# Patient Record
Sex: Male | Born: 1965 | Race: White | Hispanic: No | Marital: Married | Smoking: Never smoker
Health system: Southern US, Community
[De-identification: ages and names within clinical notes are randomized; demographics above are authoritative.]

## PROBLEM LIST (undated history)

## (undated) DIAGNOSIS — R011 Cardiac murmur, unspecified: Secondary | ICD-10-CM

## (undated) DIAGNOSIS — E039 Hypothyroidism, unspecified: Secondary | ICD-10-CM

## (undated) DIAGNOSIS — A630 Anogenital (venereal) warts: Secondary | ICD-10-CM

## (undated) DIAGNOSIS — K219 Gastro-esophageal reflux disease without esophagitis: Secondary | ICD-10-CM

## (undated) DIAGNOSIS — A4902 Methicillin resistant Staphylococcus aureus infection, unspecified site: Secondary | ICD-10-CM

## (undated) DIAGNOSIS — L719 Rosacea, unspecified: Secondary | ICD-10-CM

## (undated) HISTORY — DX: Methicillin resistant Staphylococcus aureus infection, unspecified site: A49.02

## (undated) HISTORY — DX: Rosacea, unspecified: L71.9

## (undated) HISTORY — PX: COLONOSCOPY: SHX174

## (undated) HISTORY — DX: Hypothyroidism, unspecified: E03.9

## (undated) HISTORY — DX: Anogenital (venereal) warts: A63.0

## (undated) HISTORY — DX: Gastro-esophageal reflux disease without esophagitis: K21.9

## (undated) HISTORY — PX: WISDOM TOOTH EXTRACTION: SHX21

## (undated) HISTORY — PX: TONSILLECTOMY: SUR1361

## (undated) HISTORY — DX: Cardiac murmur, unspecified: R01.1

---

## 1999-05-17 HISTORY — PX: LASIK: SHX215

## 1999-12-07 ENCOUNTER — Other Ambulatory Visit: Admission: RE | Admit: 1999-12-07 | Discharge: 1999-12-07 | Payer: Self-pay | Admitting: Gastroenterology

## 1999-12-07 ENCOUNTER — Encounter (INDEPENDENT_AMBULATORY_CARE_PROVIDER_SITE_OTHER): Payer: Self-pay | Admitting: *Deleted

## 2000-01-24 ENCOUNTER — Encounter: Payer: Self-pay | Admitting: Gastroenterology

## 2005-01-26 ENCOUNTER — Ambulatory Visit: Payer: Self-pay | Admitting: Internal Medicine

## 2005-04-27 ENCOUNTER — Ambulatory Visit: Payer: Self-pay | Admitting: Internal Medicine

## 2005-10-06 ENCOUNTER — Ambulatory Visit: Payer: Self-pay | Admitting: Internal Medicine

## 2005-10-20 ENCOUNTER — Ambulatory Visit: Payer: Self-pay | Admitting: Internal Medicine

## 2006-01-05 ENCOUNTER — Ambulatory Visit: Payer: Self-pay | Admitting: Internal Medicine

## 2006-05-11 ENCOUNTER — Ambulatory Visit: Payer: Self-pay | Admitting: Internal Medicine

## 2006-06-20 ENCOUNTER — Ambulatory Visit: Payer: Self-pay | Admitting: Internal Medicine

## 2006-08-17 ENCOUNTER — Ambulatory Visit: Payer: Self-pay | Admitting: Internal Medicine

## 2007-01-19 ENCOUNTER — Ambulatory Visit: Payer: Self-pay | Admitting: Internal Medicine

## 2007-01-22 ENCOUNTER — Ambulatory Visit: Payer: Self-pay | Admitting: Gastroenterology

## 2007-01-22 LAB — CONVERTED CEMR LAB
ALT: 18 units/L (ref 0–53)
AST: 16 units/L (ref 0–37)
BUN: 14 mg/dL (ref 6–23)
Basophils Relative: 0.5 % (ref 0.0–1.0)
Calcium: 8.9 mg/dL (ref 8.4–10.5)
Chloride: 106 meq/L (ref 96–112)
Creatinine, Ser: 1.2 mg/dL (ref 0.4–1.5)
Eosinophils Relative: 13.1 % — ABNORMAL HIGH (ref 0.0–5.0)
GFR calc Af Amer: 87 mL/min
GFR calc non Af Amer: 72 mL/min
HCT: 41.1 % (ref 39.0–52.0)
Hemoglobin: 14.4 g/dL (ref 13.0–17.0)
MCHC: 35.1 g/dL (ref 30.0–36.0)
Monocytes Absolute: 0.5 10*3/uL (ref 0.2–0.7)
Neutrophils Relative %: 62.2 % (ref 43.0–77.0)
Potassium: 4.2 meq/L (ref 3.5–5.1)
RDW: 12.6 % (ref 11.5–14.6)
WBC: 6.8 10*3/uL (ref 4.5–10.5)

## 2007-01-31 ENCOUNTER — Telehealth: Payer: Self-pay | Admitting: Internal Medicine

## 2007-02-08 ENCOUNTER — Ambulatory Visit: Payer: Self-pay | Admitting: Gastroenterology

## 2007-02-22 ENCOUNTER — Encounter: Payer: Self-pay | Admitting: Gastroenterology

## 2007-02-22 ENCOUNTER — Ambulatory Visit: Payer: Self-pay | Admitting: Gastroenterology

## 2007-02-22 ENCOUNTER — Encounter: Payer: Self-pay | Admitting: Internal Medicine

## 2007-03-27 ENCOUNTER — Ambulatory Visit: Payer: Self-pay | Admitting: Internal Medicine

## 2007-03-27 DIAGNOSIS — E039 Hypothyroidism, unspecified: Secondary | ICD-10-CM | POA: Insufficient documentation

## 2007-04-04 ENCOUNTER — Encounter (INDEPENDENT_AMBULATORY_CARE_PROVIDER_SITE_OTHER): Payer: Self-pay | Admitting: *Deleted

## 2007-05-17 DIAGNOSIS — A4902 Methicillin resistant Staphylococcus aureus infection, unspecified site: Secondary | ICD-10-CM

## 2007-05-17 HISTORY — DX: Methicillin resistant Staphylococcus aureus infection, unspecified site: A49.02

## 2007-07-26 ENCOUNTER — Ambulatory Visit: Payer: Self-pay | Admitting: Internal Medicine

## 2007-07-31 ENCOUNTER — Encounter (INDEPENDENT_AMBULATORY_CARE_PROVIDER_SITE_OTHER): Payer: Self-pay | Admitting: *Deleted

## 2007-07-31 LAB — CONVERTED CEMR LAB: TSH: 3.31 microintl units/mL (ref 0.35–5.50)

## 2007-08-14 DIAGNOSIS — Z87442 Personal history of urinary calculi: Secondary | ICD-10-CM

## 2007-08-23 ENCOUNTER — Telehealth (INDEPENDENT_AMBULATORY_CARE_PROVIDER_SITE_OTHER): Payer: Self-pay | Admitting: *Deleted

## 2007-08-31 ENCOUNTER — Ambulatory Visit: Payer: Self-pay | Admitting: Internal Medicine

## 2008-04-07 ENCOUNTER — Ambulatory Visit: Payer: Self-pay | Admitting: Internal Medicine

## 2008-04-15 ENCOUNTER — Encounter (INDEPENDENT_AMBULATORY_CARE_PROVIDER_SITE_OTHER): Payer: Self-pay | Admitting: *Deleted

## 2008-04-15 LAB — CONVERTED CEMR LAB: TSH: 6.55 microintl units/mL — ABNORMAL HIGH (ref 0.35–5.50)

## 2008-04-29 ENCOUNTER — Ambulatory Visit: Payer: Self-pay | Admitting: Gastroenterology

## 2008-06-19 ENCOUNTER — Ambulatory Visit: Payer: Self-pay | Admitting: Internal Medicine

## 2008-06-23 ENCOUNTER — Encounter (INDEPENDENT_AMBULATORY_CARE_PROVIDER_SITE_OTHER): Payer: Self-pay | Admitting: *Deleted

## 2008-12-17 ENCOUNTER — Ambulatory Visit: Payer: Self-pay | Admitting: Internal Medicine

## 2008-12-22 ENCOUNTER — Telehealth (INDEPENDENT_AMBULATORY_CARE_PROVIDER_SITE_OTHER): Payer: Self-pay | Admitting: *Deleted

## 2008-12-24 ENCOUNTER — Ambulatory Visit: Payer: Self-pay | Admitting: Internal Medicine

## 2008-12-26 ENCOUNTER — Encounter (INDEPENDENT_AMBULATORY_CARE_PROVIDER_SITE_OTHER): Payer: Self-pay | Admitting: *Deleted

## 2009-04-07 ENCOUNTER — Ambulatory Visit: Payer: Self-pay | Admitting: Internal Medicine

## 2009-04-08 ENCOUNTER — Ambulatory Visit: Payer: Self-pay | Admitting: Internal Medicine

## 2009-04-08 DIAGNOSIS — R030 Elevated blood-pressure reading, without diagnosis of hypertension: Secondary | ICD-10-CM | POA: Insufficient documentation

## 2009-04-08 DIAGNOSIS — H9319 Tinnitus, unspecified ear: Secondary | ICD-10-CM

## 2009-04-08 LAB — CONVERTED CEMR LAB: TSH: 4.86 microintl units/mL (ref 0.35–5.50)

## 2009-04-13 ENCOUNTER — Telehealth: Payer: Self-pay | Admitting: Internal Medicine

## 2009-04-14 ENCOUNTER — Telehealth: Payer: Self-pay | Admitting: Internal Medicine

## 2009-07-02 ENCOUNTER — Telehealth: Payer: Self-pay | Admitting: Gastroenterology

## 2009-07-03 ENCOUNTER — Telehealth: Payer: Self-pay | Admitting: Gastroenterology

## 2009-07-14 ENCOUNTER — Telehealth (INDEPENDENT_AMBULATORY_CARE_PROVIDER_SITE_OTHER): Payer: Self-pay | Admitting: *Deleted

## 2009-07-15 ENCOUNTER — Ambulatory Visit: Payer: Self-pay | Admitting: Internal Medicine

## 2009-08-04 ENCOUNTER — Ambulatory Visit: Payer: Self-pay | Admitting: Gastroenterology

## 2009-11-25 ENCOUNTER — Telehealth: Payer: Self-pay | Admitting: Gastroenterology

## 2009-12-18 ENCOUNTER — Telehealth (INDEPENDENT_AMBULATORY_CARE_PROVIDER_SITE_OTHER): Payer: Self-pay | Admitting: *Deleted

## 2009-12-21 ENCOUNTER — Ambulatory Visit: Payer: Self-pay | Admitting: Internal Medicine

## 2009-12-21 LAB — CONVERTED CEMR LAB
Albumin: 4.1 g/dL (ref 3.5–5.2)
Basophils Relative: 0.3 % (ref 0.0–3.0)
Blood in Urine, dipstick: NEGATIVE
Chloride: 106 meq/L (ref 96–112)
Cholesterol: 144 mg/dL (ref 0–200)
Eosinophils Absolute: 0.3 10*3/uL (ref 0.0–0.7)
HDL: 38.2 mg/dL — ABNORMAL LOW (ref >39.00)
Hemoglobin: 14.2 g/dL (ref 13.0–17.0)
Ketones, urine, test strip: NEGATIVE
MCHC: 34.6 g/dL (ref 30.0–36.0)
MCV: 90.3 fL (ref 78.0–100.0)
Monocytes Absolute: 0.4 10*3/uL (ref 0.1–1.0)
Neutro Abs: 3.1 10*3/uL (ref 1.4–7.7)
Nitrite: NEGATIVE
Potassium: 4.5 meq/L (ref 3.5–5.1)
RBC: 4.53 M/uL (ref 4.22–5.81)
RDW: 14.1 % (ref 11.5–14.6)
Sodium: 141 meq/L (ref 135–145)
TSH: 3.14 microintl units/mL (ref 0.35–5.50)
Total Protein: 6.8 g/dL (ref 6.0–8.3)
Triglycerides: 120 mg/dL (ref 0.0–149.0)
Urobilinogen, UA: 0.2
VLDL: 24 mg/dL (ref 0.0–40.0)
WBC Urine, dipstick: NEGATIVE

## 2009-12-29 ENCOUNTER — Ambulatory Visit: Payer: Self-pay | Admitting: Internal Medicine

## 2010-06-13 LAB — CONVERTED CEMR LAB
ALT: 19 units/L (ref 0–53)
AST: 20 units/L (ref 0–37)
Alkaline Phosphatase: 50 units/L (ref 39–117)
BUN: 13 mg/dL (ref 6–23)
Basophils Absolute: 0 10*3/uL (ref 0.0–0.1)
Basophils Relative: 0.1 % (ref 0.0–1.0)
Basophils Relative: 0.1 % (ref 0.0–3.0)
CO2: 30 meq/L (ref 19–32)
Chloride: 107 meq/L (ref 96–112)
Cholesterol: 133 mg/dL (ref 0–200)
Creatinine, Ser: 0.9 mg/dL (ref 0.4–1.5)
Eosinophils Relative: 2.6 % (ref 0.0–5.0)
GFR calc Af Amer: 121 mL/min
GFR calc non Af Amer: 78.4 mL/min (ref >60)
HCT: 40.7 % (ref 39.0–52.0)
HCT: 45.3 % (ref 39.0–52.0)
HDL: 34.1 mg/dL — ABNORMAL LOW (ref >39.0)
Hemoglobin: 14.3 g/dL (ref 13.0–17.0)
Hemoglobin: 15.5 g/dL (ref 13.0–17.0)
LDL Cholesterol: 87 mg/dL (ref 0–99)
Lymphocytes Relative: 12.1 % (ref 12.0–46.0)
Lymphs Abs: 1.1 10*3/uL (ref 0.7–4.0)
MCHC: 34.3 g/dL (ref 30.0–36.0)
MCV: 90 fL (ref 78.0–100.0)
Monocytes Absolute: 0.4 10*3/uL (ref 0.1–1.0)
Monocytes Absolute: 0.4 10*3/uL (ref 0.2–0.7)
Monocytes Relative: 6.6 % (ref 3.0–11.0)
Monocytes Relative: 7.9 % (ref 3.0–12.0)
Neutro Abs: 5.1 10*3/uL (ref 1.4–7.7)
Neutrophils Relative %: 78.4 % — ABNORMAL HIGH (ref 43.0–77.0)
Platelets: 150 10*3/uL (ref 150.0–400.0)
Potassium: 3.8 meq/L (ref 3.5–5.1)
Potassium: 4.3 meq/L (ref 3.5–5.1)
RBC: 4.52 M/uL (ref 4.22–5.81)
RDW: 12.3 % (ref 11.5–14.6)
Sodium: 140 meq/L (ref 135–145)
Sodium: 141 meq/L (ref 135–145)
TSH: 6.97 microintl units/mL — ABNORMAL HIGH (ref 0.35–5.50)
Total Bilirubin: 0.8 mg/dL (ref 0.3–1.2)
Total Bilirubin: 0.9 mg/dL (ref 0.3–1.2)
Total CHOL/HDL Ratio: 4
Total Protein: 7.2 g/dL (ref 6.0–8.3)
Total Protein: 7.5 g/dL (ref 6.0–8.3)
VLDL: 19.2 mg/dL (ref 0.0–40.0)
WBC: 4.7 10*3/uL (ref 4.5–10.5)

## 2010-06-15 NOTE — Progress Notes (Signed)
Summary: Questions about meds  Medications Added ASACOL 400 MG TBEC (MESALAMINE) 3 by mouth two times a day       Phone Note Call from Patient Call back at Home Phone [phone]   Caller: Patient Call For: Dr. Fuller Plan Reason for Call: Talk to Nurse Summary of Call: pt. is taking Lialda and wants to know if he can go back to taking the Asacol Initial call taken by: Webb Laws,  November 25, 2009 3:10 PM  Follow-up for Phone Call        Patient  would like to return to Asacol.  He is c/o gassy, not having regular BM, nausea.  He started with these symptoms when he started on Lialda, he thought these symptoms would improve after being on Lialda for a while, but they now seem to be getting worse.  he would like to switch back to asacol.  Please advise Follow-up by: Barb Merino RN, [place],  November 25, 2009 3:30 PM  Additional Follow-up for Phone Call Additional follow up Details #1::        Can [place] and resume Asacol 400 Mg  Tbec (Mesalamine) .... Take 3 Tablets By Mouth Two Times A Day  Additional Follow-up by: Ladene Artist MD Marval Regal,  November 25, 2009 3:35 PM    Additional Follow-up for Phone Call Additional follow up Details #2::    Patient  aware new rx sent.  i have left him a message.  I have asked him to call me back if this is not the correct pharmacy. Follow-up by: Barb Merino RN, [place],  November 26, 2009 9:08 AM  New/Updated Medications: ASACOL 400 MG TBEC (MESALAMINE) 3 by mouth two times a day Prescriptions: ASACOL 400 MG TBEC (MESALAMINE) 3 by mouth two times a day  #180 x 6   Entered by:   Barb Merino RN, [place]   Authorized by:   Ladene Artist MD [facility]   Signed by:   Barb Merino RN, CGRN on 11/26/2009   Method used:   Electronically to        [facility]* (retail)       [address] [place], [place]  [address]       Ph: [phone]       Fax: [phone]   RxID:   [id number]

## 2010-06-15 NOTE — Assessment & Plan Note (Signed)
Summary: medication refill--ch.   History of Present Illness Visit Type: Follow-up Visit Primary GI MD: Joylene Igo MD [facility] Primary Provider: Unice Cobble, MD Requesting Provider: n/a Chief Complaint: Needs Asacol refills, No GI complaints History of Present Illness:   This is a return visit for inflammatory bowel disease, presenting as colitis, type unspecified, diagnosed in 2001. His been asymptomatic on Asacol. He states that he often forgets his evening dose of Asacol.   GI Review of Systems      Denies abdominal pain, acid reflux, belching, bloating, chest pain, dysphagia with liquids, dysphagia with solids, heartburn, loss of appetite, nausea, vomiting, vomiting blood, weight loss, and  weight gain.        Denies anal fissure, black tarry stools, change in bowel habit, constipation, diarrhea, diverticulosis, fecal incontinence, heme positive stool, hemorrhoids, irritable bowel syndrome, jaundice, light color stool, liver problems, rectal bleeding, and  rectal pain.   Current Medications (verified): 1)  Synthroid 75 Mcg  Tabs (Levothyroxine Sodium) .Marland Kitchen.. 1 Once Daily  Except 1&1/2 T,th, & Sat 2)  Asacol 400 Mg  Tbec (Mesalamine) .... Take 3 Tablets By Mouth Two Times A Day  Allergies (verified): 1)  ! * Doxycycline Family  Past History:  Past Surgical History: Last updated: 12/24/2008 Colonoscopy : colitis 2002 & 2008, Dr Fuller Plan (Note: asymptomatic 2003-2008 off meds) Tonsillectomy Lasik bilat 2001  Family History: Last updated: 12/24/2008 Father:OA Mother: thyroid condition, benign R occipital cns tumor Siblings: neg Family History of Colon Cancer (? appendiceal primary): Mat Uncle Family History of Colon Polyps: Pat. Uncle; MGF HTN,lung CA; PGF COAD,? 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 All other ROS were reviewed and were negative.   Vital Signs:  Patient profile:   45 year old male Height:      72 inches Weight:      185 pounds BMI:     25.18 BSA:     2.06 Pulse rate:   66 / minute Pulse rhythm:   regular BP sitting:   98 / 62  (left arm)  Vitals Entered By: Dona Ana Deborra Medina) (August 04, 2009 3:51 PM)  Physical Exam  General:  Well developed, well nourished, no acute distress. Head:  Normocephalic and atraumatic. Eyes:  PERRLA, no icterus. Ears:  Normal auditory acuity. Mouth:  No deformity or lesions, dentition normal. Lungs:  Clear throughout to auscultation. Heart:  Regular rate and rhythm; no murmurs, rubs,  or bruits. Abdomen:  Soft, nontender and nondistended. No masses, hepatosplenomegaly or hernias noted. Normal bowel sounds. Psych:  Alert and cooperative. Normal mood and affect.  Impression & Recommendations:  Problem # 1:  COLITIS (ICD-558.9) IBD, type unspecified. Original diagnosis in 2001. Change to Lialda 2.4 g q.a.m. to help with compliance. Surveillance colonoscopy October 2013.  Patient Instructions: 1)  Pick up your prescription from your pharmacy.  2)  Please schedule a follow-up appointment in 1 year. 3)  The medication list was reviewed and reconciled.  All changed / newly prescribed medications were explained.  A complete medication list was provided to the patient / caregiver.  Prescriptions: LIALDA 1.2 GM TBEC (MESALAMINE) 2 tablets by mouth every morning  #60 x 11    Entered by:   Marlon Pel CMA ([place])   Authorized by:   Ladene Artist MD [facility]   Signed by:   Marlon Pel CMA ([place]) on 08/04/2009   Method used:   Electronically to        [facility]* (retail)       [address]       [place], [place]  [address]       Ph: [phone]       Fax: [phone]   RxID:   [id number]

## 2010-06-15 NOTE — Progress Notes (Signed)
Summary: Schedule REV    Phone Note Outgoing Call Call back at [facility] Phone [phone]   Call placed by: Marlon Pel CMA Deborra Medina),  July 02, 2009 4:09 PM Call placed to: Patient Summary of Call: left message for pt  to call back and schedule a REV to get his Asacol refilled. I informed pt that he has to be seen in the office before we can call and get his PA done for his Asacol.  Initial call taken by: Marlon Pel CMA Deborra Medina),  July 02, 2009 4:11 PM

## 2010-06-15 NOTE — Progress Notes (Signed)
Summary: Medication  Medications Added ASACOL 400 MG  TBEC (MESALAMINE) take 3 tablets by mouth two times a day       Phone Note Call from Patient Call back at Home Phone [phone]   Caller: Patient Call For: Dr. Fuller Plan Summary of Call: Pt scheduled for 08-04-09 and is completely out of his Asacol. Initial call taken by: Webb Laws,  July 03, 2009 1:58 PM  Follow-up for Phone Call        left message for pt  to call back  Follow-up by: Marlon Pel CMA Deborra Medina),  July 03, 2009 2:39 PM  Additional Follow-up for Phone Call Additional follow up Details #1::        Given one refill until appt on 3-22 and told pt to keep appt for any further refills.  Additional Follow-up by: Marlon Pel CMA ([place]),  July 03, 2009 3:09 PM    New/Updated Medications: ASACOL 400 MG  TBEC (MESALAMINE) take 3 tablets by mouth two times a day Prescriptions: ASACOL 400 MG  TBEC (MESALAMINE) take 3 tablets by mouth two times a day  #180 x 0   Entered by:   Marlon Pel CMA ([place])   Authorized by:   Ladene Artist MD [facility]   Signed by:   Marlon Pel CMA ([place]) on 07/03/2009   Method used:   Electronically to        [facility]* (retail)       [address]       [place], [place]  [address]       Ph: [phone]       Fax: [phone]   RxID:   [id number]

## 2010-06-15 NOTE — Assessment & Plan Note (Signed)
Summary: CPX/YEARLY//PH   Vital Signs:  Patient profile:   45 year old male Height:      72 inches Weight:      184 pounds Temp:     98.2 degrees F oral Pulse rate:   80 / minute Resp:     20 per minute BP sitting:   110 / 78  (left arm)  Vitals Entered By: Rolla Flatten CMA (December 29, 2009 1:00 PM)  CC: cpx, , General Medical Evaluation   Primary Care Provider:  Unice Cobble, MD  CC:  cpx, , and General Medical Evaluation.  History of Present Illness: Tanner White is here for a physical; he is asymptomatic.  Preventive Screening-Counseling & Management  Caffeine-Diet-Exercise     Does Patient Exercise: no  Current Medications (verified): 1)  Synthroid 75 Mcg  Tabs (Levothyroxine Sodium) .Marland Kitchen.. 1 Once Daily  Except 1&1/2 T,th, & Sat 2)  Asacol 400 Mg Tbec (Mesalamine) .... 3 By Mouth Two Times A Day  Allergies (verified): 1)  ! 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 primary appendicial Pat. Uncle: colon polyps; MGF :HTN,lung cancer; PGF: COAD,? 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         Weight up 5# off CVE. No exertional chest pain  Physical Exam  General:  well-nourished; alert,appropriate and cooperative throughout examination Head:  Normocephalic and atraumatic without obvious abnormalities. No apparent alopecia ; moustache & goatee Eyes:  No corneal or conjunctival inflammation noted. Perrla. Funduscopic exam benign, without hemorrhages, exudates or papilledema. No lid lag. Ears:  External ear exam shows no significant lesions or deformities.  Otoscopic examination reveals clear canals, tympanic membranes are intact bilaterally without bulging, retraction, inflammation or discharge. Hearing is grossly normal bilaterally. Nose:  External nasal examination shows no deformity or inflammation. Nasal mucosa are pink and moist without lesions or exudates. septal dislocation & deviation Mouth:  Oral mucosa and oropharynx without lesions or exudates.  Teeth in good repair. Neck:  No deformities, masses, or tenderness noted. Lungs:  Normal respiratory effort, chest expands symmetrically. Lungs are clear to auscultation, no crackles or wheezes. Heart:  Normal rate and regular rhythm. S1 and S2 normal without gallop, murmur, click, rub .S4 Abdomen:  Bowel sounds positive,abdomen soft and non-tender without masses, organomegaly or hernias noted but weakness L inguinal  area Rectal:  No external abnormalities noted. Normal sphincter tone. No rectal masses or tenderness. Genitalia:  Testes bilaterally descended without nodularity,  tenderness or masses. No scrotal  masses or lesions. No penis lesions or urethral discharge. L varicocele.   Prostate:  Prostate gland firm and smooth, no enlargement but R lobe slightly smaller w/o  nodularity, tenderness, mass,  or induration. Msk:  No deformity or scoliosis noted of thoracic or lumbar spine.   Pulses:  R and L carotid,radial,dorsalis pedis and posterior tibial pulses are full and equal bilaterally Extremities:  No clubbing, cyanosis, edema, or deformity noted with normal full range of motion of all joints.   Neurologic:  alert & oriented X3 and DTRs symmetrical and normal.   No tremor  Skin:  Intact without suspicious lesions or rashes Cervical Nodes:  No lymphadenopathy noted Axillary Nodes:  No palpable lymphadenopathy Inguinal Nodes:  No significant adenopathy Psych:  memory intact for recent and remote, normally interactive, and good eye contact.     Impression & Recommendations:  Problem # 1:  ROUTINE GENERAL MEDICAL EXAM@HEALTH  CARE FACL (ICD-V70.0)  Orders: EKG w/ Interpretation (93000)  Problem # 2:  HYPOTHYROIDISM (ICD-244.9)  Problem # 3:  COLITIS (ICD-558.9) as per Dr Fuller Plan  Complete Medication List: 1)  Synthroid 75 Mcg Tabs (levothyroxine Sodium)  .Marland Kitchen.. 1 once daily  except 1&1/2 t,th, & sat 2)  Asacol 400 Mg Tbec (Mesalamine) .... 3 by mouth two times a day  Patient Instructions: 1)  It is important that you exercise regularly at least 20 minutes 5 times a week. If you develop chest pain, have severe difficulty breathing, or feel very tired , stop exercising immediately and seek medical attention. Prescriptions: SYNTHROID 75 MCG  TABS (LEVOTHYROXINE SODIUM) 1 once daily  except 1&1/2 T,Th, & Sat  #90 x 3   Entered and Authorized by:   Unice Cobble MD   Signed by:   Unice Cobble MD on 12/29/2009   Method used:   Print then Give to Patient   RxID:   [id number]

## 2010-06-15 NOTE — Procedures (Signed)
Summary: Conservation officer, historic buildings   Imported By: Phillis Knack 08/05/2009 10:09:30  _____________________________________________________________________  External Attachment:    Type:   Image     Comment:   External Document

## 2010-06-15 NOTE — Progress Notes (Signed)
Summary: [facility]  Phone Note Call from Patient   Summary of Call: PT IS COMING IN ON MONDAY FOR LABS AND HAS AN APPT FOR 8 AM --NEEDA ORDERS TO BE PUT IN FOR APPT. CPX ON THE 8/16 Initial call taken by: Velora Heckler,  December 18, 2009 2:55 PM  Follow-up for Phone Call        Lipid, CBCD, Hep, BMP,TSH,PSA, Stool Cards, Udip V70.0/244.9 Follow-up by: Georgette Dover CMA,  December 18, 2009 3:16 PM

## 2010-06-15 NOTE — Progress Notes (Signed)
Summary: refill request  Phone Note Refill Request Message from:  Fax from Pharmacy on July 14, 2009 2:56 PM  Refills Requested: Medication #1:  SYNTHROID 75 MCG  TABS (LEVOTHYROXINE SODIUM) 1 once daily (generic OK)   Dosage confirmed as above?Dosage Confirmed   Brand Name Necessary? No refill request to [place]  Initial call taken by: Otho Ket,  July 14, 2009 2:58 PM  Follow-up for Phone Call        Patient was due to recheck labs2/2011 per 04/08/2009 append re:TSH. Please contact patient to schedule. We will fill med x 1 until labs checked Follow-up by: Georgette Dover,  July 14, 2009 3:20 PM  Additional Follow-up for Phone Call Additional follow up Details #1::        contacted pt. and scheduled TSH to drawn this week, and informed him a refill x1 had beed called in.Jennifer Rich  July 14, 2009 4:01 PM       Prescriptions: SYNTHROID 75 MCG  TABS (LEVOTHYROXINE SODIUM) 1 once daily (generic OK)  #90 x 0   Entered by:   Georgette Dover   Authorized by:   Unice Cobble MD   Signed by:   Georgette Dover on 07/14/2009   Method used:   Faxed to ...       [facility]* (retail)       [address]       [place], [place]  [address]       Ph: [phone]       Fax: [phone]   RxID:   [id number]

## 2010-06-25 ENCOUNTER — Ambulatory Visit (INDEPENDENT_AMBULATORY_CARE_PROVIDER_SITE_OTHER): Payer: BC Managed Care – PPO | Admitting: Internal Medicine

## 2010-06-25 ENCOUNTER — Encounter: Payer: Self-pay | Admitting: Internal Medicine

## 2010-06-25 DIAGNOSIS — J069 Acute upper respiratory infection, unspecified: Secondary | ICD-10-CM

## 2010-07-01 NOTE — Assessment & Plan Note (Signed)
Summary: congested,nose red/cbs   Vital Signs:  Patient profile:   45 year old male Weight:      183 pounds BMI:     24.91 Temp:     98.2 degrees F oral Pulse rate:   64 / minute Resp:     14 per minute BP sitting:   124 / 80  (left arm) Cuff size:   large  Vitals Entered By: Georgette Dover CMA (June 25, 2010 10:43 AM) CC: ? sinus infection-bridge of nose hurts, unable to wash face or touch nose : Inflammed, red, and painful, URI symptoms   Primary Care Provider:  Unice Cobble, MD  CC:  ? sinus infection-bridge of nose hurts, unable to wash face or touch nose : Inflammed, red, and painful, and URI symptoms.  History of Present Illness:    Onset 10 days ago as neck stffness followed by head congestion & ear pressure. He now  reports  tenderness over bridge of nose, scant purulent nasal discharge and dry cough, but denies nasal congestion, sore throat, and earache.  The patient denies fever, dyspnea, and wheezing.  The patient denies itchy watery eyes, sneezing, and headache.  The patient denies the following risk factors for Strep sinusitis: bilateral facial pain, tooth pain, and tender adenopathy.  Rx: Advil Cold & Sinus. PMH of MRSA OS.  Current Medications (verified): 1)  Synthroid 75 Mcg  Tabs (Levothyroxine Sodium) .Marland Kitchen.. 1 Once Daily  Except 1&1/2 T,th, & Sat 2)  Asacol 400 Mg Tbec (Mesalamine) .... 3 By Mouth Two Times A Day  Allergies: 1)  ! * Doxycycline Family  Physical Exam  General:  well-nourished,in no acute distress; alert,appropriate and cooperative throughout examination Eyes:  No corneal or conjunctival inflammation noted. EOMI. Perrla.  Vision grossly normal. Ears:  External ear exam shows no significant lesions or deformities.  Otoscopic examination reveals clear canals, tympanic membranes are intact bilaterally without bulging, retraction, inflammation or discharge. Hearing is grossly normal bilaterally. Nose:  External nasal examination shows no deformity  or inflammation. Nasal mucosa are  dry & erythematous on R  without lesions or exudates. Minimal septal dislocation. Tender over nasal  bridge Mouth:  Oral mucosa and oropharynx without lesions or exudates.  Teeth in good repair. Lungs:  Normal respiratory effort, chest expands symmetrically. Lungs are clear to auscultation, no crackles or wheezes. Skin:  Intact without suspicious lesions or rashes Cervical Nodes:  No lymphadenopathy noted Axillary Nodes:  No palpable lymphadenopathy   Impression & Recommendations:  Problem # 1:  URI (ICD-465.9) tender nasal bridge; PMH of MRSA OS  Complete Medication List: 1)  Synthroid 75 Mcg Tabs (levothyroxine Sodium)  .Marland Kitchen.. 1 once daily  except 1&1/2 t,th, & sat 2)  Asacol 400 Mg Tbec (Mesalamine) .... 3 by mouth two times a day 3)  Cephalexin 500 Mg Caps (Cephalexin) .Marland Kitchen.. 1 two times a day  Patient Instructions: 1)  Warm compresses three times a day as needed to nasal bridge . Prescriptions: CEPHALEXIN 500 MG CAPS (CEPHALEXIN) 1 two times a day  #20 x 0   Entered and Authorized by:   Unice Cobble MD   Signed by:   Unice Cobble MD on 06/25/2010   Method used:   Electronically to        [facility] [address] (retail)       [address]       [place], [place]  [address]       Ph: [phone] or [phone]  Fax: [phone]   RxID:   [id number]    Orders Added: 1)  Est. Patient Level III [84784]

## 2010-07-21 ENCOUNTER — Encounter: Payer: Self-pay | Admitting: Internal Medicine

## 2010-07-21 ENCOUNTER — Ambulatory Visit (INDEPENDENT_AMBULATORY_CARE_PROVIDER_SITE_OTHER): Payer: BC Managed Care – PPO | Admitting: Internal Medicine

## 2010-07-21 ENCOUNTER — Telehealth (INDEPENDENT_AMBULATORY_CARE_PROVIDER_SITE_OTHER): Payer: Self-pay | Admitting: *Deleted

## 2010-07-21 DIAGNOSIS — L0201 Cutaneous abscess of face: Secondary | ICD-10-CM

## 2010-07-21 DIAGNOSIS — L03211 Cellulitis of face: Secondary | ICD-10-CM | POA: Insufficient documentation

## 2010-07-21 DIAGNOSIS — L719 Rosacea, unspecified: Secondary | ICD-10-CM | POA: Insufficient documentation

## 2010-07-27 NOTE — Assessment & Plan Note (Signed)
Summary: sore/nose/cbs   Vital Signs:  Patient profile:   45 year old male Weight:      183.4 pounds BMI:     24.96 Temp:     97.9 degrees F oral Pulse rate:   60 / minute Resp:     12 per minute BP sitting:   124 / 86  (left arm) Cuff size:   large  Vitals Entered By: Georgette Dover CMA (July 21, 2010 10:48 AM) CC: Nose sore(ongoing concern)  Comments Patient using cream for Roscea   Primary Care Provider:  Unice Cobble, MD  CC:  Nose sore(ongoing concern) .  History of Present Illness:    Recurrent redness & swelling over bridge of nose; onset 03/05 ( this is 3rd episode ; initial episode was in  2010) .It  took 4-5 days for response with  last  course of antibiotics (Cephalexin). PMH of MRSA in [place] in ?2008, treated by Dr Lucita Ferrara, Ophth with Vancomycin . PMH of  Rosacea treated with Metronidazole cream by Dr Dayton Martes.  Current Medications (verified): 1)  Synthroid 75 Mcg  Tabs (Levothyroxine Sodium) .Marland Kitchen.. 1 Once Daily  Except 1&1/2 T,th, & Sat 2)  Asacol 400 Mg Tbec (Mesalamine) .... 3 By Mouth Two Times A Day  Allergies: 1)  ! * Doxycycline Family  Review of Systems General:  Denies chills, fever, and sweats. Eyes:  Denies discharge, eye pain, red eye, and vision loss-both eyes. ENT:  Denies nasal congestion and sore throat; No frontal headache, facial pain, dental pain  or purulence.  Physical Exam  General:  well-nourished,in no acute distress; alert,appropriate and cooperative throughout examination Eyes:  No corneal or conjunctival inflammation noted. EOMI. Perrla. Vision grossly normal. Ears:  External ear exam shows no significant lesions or deformities.  Otoscopic examination reveals clear canals, tympanic membranes are intact bilaterally without bulging, retraction, inflammation or discharge. Hearing is grossly normal bilaterally. Nose:  External nasal examination shows no deformity or inflammation. Nasal mucosa are pink and moist without lesions or  exudates. Septal dislocation & deviation Mouth:  Oral mucosa and oropharynx without lesions or exudates.  Teeth in good repair. Skin:  7X7 mm tender, erythema over bridge of nose Cervical Nodes:  No lymphadenopathy noted Axillary Nodes:  No palpable lymphadenopathy   Impression & Recommendations:  Problem # 1:  CELLULITIS, FACE (ICD-682.0) recurrent over nasal bridge The following medications were removed from the medication list:    Cephalexin 500 Mg Caps (Cephalexin) .Marland Kitchen... 1 two times a day His updated medication list for this problem includes:    Amoxicillin-pot Clavulanate 500-125 Mg Tabs (Amoxicillin-pot clavulanate) .Marland Kitchen... 1 every 12 hrs with a meal  Problem # 2:  ROSACEA (MSX-115.3)  Complete Medication List: 1)  Synthroid 75 Mcg Tabs (levothyroxine Sodium)  .Marland Kitchen.. 1 once daily  except 1&1/2 t,th, & sat 2)  Asacol 400 Mg Tbec (Mesalamine) .... 3 by mouth two times a day 3)  Hibiclens 4 % Liqd (Chlorhexidine gluconate) .... Wash affected area two times a day 4)  Amoxicillin-pot Clavulanate 500-125 Mg Tabs (Amoxicillin-pot clavulanate) .Marland Kitchen.. 1 every 12 hrs with a meal  Patient Instructions: 1)  Apply Metonidazole two times a day after Hibiclens cleansing  Prescriptions: AMOXICILLIN-POT CLAVULANATE 500-125 MG TABS (AMOXICILLIN-POT CLAVULANATE) 1 every 12 hrs with a meal  #14 x 0   Entered and Authorized by:   Unice Cobble MD   Signed by:   Unice Cobble MD on 07/21/2010   Method used:   Electronically to  [facility]ail[facility]       [address]       [place], [place]  [address]       Ph: [phone] or [phone]       Fax: [phone]   RxID:   [id number] HIBICLENS 4 % LIQD (CHLORHEXIDINE GLUCONATE) wash affected area two times a day  #OTC x 0   Entered and Authorized by:   Unice Cobble MD   Signed by:   Unice Cobble MD on 07/21/2010   Method used:   Historical   RxID:   [id number]    Orders Added: 1)  Est. Patient Level III [76720]

## 2010-07-27 NOTE — Progress Notes (Signed)
Summary: correct med he is on  Phone Note Call from Patient Call back at Home Phone [phone]   Caller: Patient Summary of Call: Patient was seen today and told Dr. the wrong med he was on and this is the correct on prascion ra cr/alclometasone this is a cream that he apply up to 2 time a day as needed ( Metonidazol is not correct). This is what the Dr. need to know. Initial call taken by: Velora Heckler,  July 21, 2010 11:39 AM    New/Updated Medications: PRASCION RA 10-5 % CREA (SULFACETAMIDE-SULFUR-SUNSCREEN) apply two times a day as needed  Appended Document: correct med he is on Please enter into Med List. I shall send Dr Nevada Crane copy of OV . He should F/U with Dr Nevada Crane to see if med change necessary due to recurrent cellulitis, possibly related to Rosacea flares

## 2010-09-28 NOTE — Assessment & Plan Note (Signed)
[facility]                         GASTROENTEROLOGY OFFICE NOTE   Tanner White                    MRN:          [id number]  DATE:01/22/2007                            DOB:          October 22, 1965    REFERRING PHYSICIAN:  Kathlene November, MD   PROBLEM:  Diarrhea and abdominal cramping, fatigue.   HISTORY:  This is a generally healthy 45 year old white male known  remotely to Tanner White, who was evaluated in 2001.  At that time he  was having problems with hemoccult positive stool and colonoscopy showed  some questionable colitis in his cecum.  The cecal biopsies showed a  chronic active colitis with numerous lymphoid nodules.  There were also  minimal crypt abscesses noted, therefore more consistent with Crohn's  disease.  He also had an IBD panel done, first step, which showed a  positive ASCA and a negative pANCA and negative OMP-C.  Therefore, more  consistent with a pattern of Crohn's disease.  He was placed on Asacol 6  tablets a day.  However, he has not been seen in the past 7 years.  He  says that he took the Asacol for awhile, was feeling well, was not  really having any problems and stopped it and he has not been on any  medicine over the past 6 years.  He says over the past few months he has  had some intermittent abdominal cramping which usually occurs in the  middle of the night, this will often be followed by episodes of diarrhea  and this may last for a day or so and then clear.  He says this might  happen once a month or so.  He has noticed some increased mucus in his  stools as well, some very small amounts of blood mixed in with his bowel  movement and an increase in generalized fatigue.  Last week, about 6  days ago, he had abrupt onset of more profuse diarrhea, up to 20 bowel  movements per day.  He had some abdominal cramping but no change in the  amount of blood in his stool.  He also ran a fever for a couple of days  to  101 max.  He was nauseated but without vomiting.  He was seen by Dr.  Larose White this past Friday, restarted on Asacol and given some Canasa  suppositories.  He says at this point, over the past 2 days, he has been  improving and really has not had any diarrhea today, his nausea has  abated and his appetite is better, his fever has also resolved.  He has  had antibiotics earlier this year but none since April of 2008.   CURRENT MEDICATIONS:  1. Synthroid 25 mcg, 1-1/2 tablets daily.  2. Asacol 400 mg, two p.o. t.i.d.  3. Canasa suppositories b.i.d.   ALLERGIES/INTOLERANCES:  DOXYCYCLINE.   PAST HISTORY:  1. Hypothyroidism.  2. MRSA infection in his left eye of unclear source.  He had no known      trauma, etc., earlier this year which was treated with antibiotics      and has  cleared.   SOCIAL HISTORY:  The patient is married. He has 1 child. He is employed  in [employer]. He is a nonsmoker. Drinks alcohol occasionally on the  weekends.   FAMILY HISTORY:  Pertinent for one uncle with some sort of intestinal  cancer.  They are not certain whether or not this was colon cancer. He  has a maternal grandfather with lung cancer.  Paternal grandfather with history of alcoholism.   REVIEW OF SYSTEMS:  Pertinent only for allergy sinus symptoms and he has  been told that he has a slight heart murmur in the past.  GI:  As  outlined above.  All other review of systems completely negative.   PHYSICAL EXAM:  A well-developed, thin white male in no acute distress.  Height is 6 feet, weight is 178.8, blood pressure 120/62, pulse is 66.  HEENT:  Nontraumatic, normocephalic, EOMI, PERRLA, sclera anicteric.  NECK:  Supple.  CARDIOVASCULAR:  Regular rate and rhythm with S1 and S2.  No murmur, rub  or gallop.  PULMONARY:  Clear to A&P.  ABDOMEN:  Soft.  He is basically nontender.  There is no palpable mass  or hepatosplenomegaly, no guarding or rebound, bowel sounds are active.  RECTAL EXAM:  Not  done today.  EXTREMITIES:  No clubbing, cyanosis or edema.   IMPRESSION:  74. A 45 year old male with probable Crohn's colitis, asymptomatic over      the past several years, with a 2 month history of fatigue,      intermittent abdominal cramping, mucusy stools and small volume      hematochezia, suspect he has had an exacerbation of his      inflammatory bowel disease.  2. Probable acute infectious enteritis now resolving.   White:  1. Increase Asacol to 400 mg three tablets t.i.d.  2. Check CBC with diff., CMET and sed. rate.  3. Schedule colonoscopy with Dr. Fuller White. Risks, benefits, alternatives      discussed with the patient and he concents to proceed.  4. As the patient's acute symptoms are resolving, we may not be able      to identify any specific organisms; however, will obtain stool      cultures and observe for any clinical relapse.      Tanner Ba, PA-C  Electronically Signed      Tanner Riffle. Fuller Plan, MD, [facility]  Electronically Signed   AE/MedQ  DD: 01/22/2007  DT: 01/23/2007  Job #: [id number]   cc:   Tanner Penna. Linna Darner, MD,FACP,FCCP

## 2010-10-01 NOTE — Assessment & Plan Note (Signed)
[place] OFFICE NOTE   [name], [name]                    MRN:          [id number]  DATE:08/17/2006                            DOB:          [birth year]    Tanner White was seen on August 17, 2006 for followup of protracted  upper respiratory tract infection. The symptoms began as glandular  swelling in the cervical area associated with green nasal discharge. He  was seen in urgent care and given Biaxin. He has also had cough  productive of green material; his sputum is now clearing. Overall, he  feels he is at least 60% better after five days of the Biaxin. Symptoms  of frontal sinus pressure and head congestion and headache have all  improved. Additionally, the low-grade fevers have resolved. Presently,  his major symptom is fatigue, which has persisted for three weeks with a  sore throat and left ear pain.   Significantly, six months ago, he had MRSA in the left eye. He was seen  by Dr. Gershon Crane and referred to Dr. Lucita Ferrara and finally  to [facility]. He was treated with vancomycin topically for one and a half weeks  and then due to intolerance was placed on gentamicin. He did have LASIK  surgery in 2001 with no evidence of infection @ that time.   Other history includes possible kidney stone at age 50. He has had a  colonoscopy to evaluate colitis.   He is presently on Restasis eye drops, Rosax topically for rosacea and  Synthroid 25 mcg one and a half daily.   DOXYCYCLINE HAS RESULTED IN URTICARIA.   REVIEW OF SYSTEMS:  Is also positive for some post-nasal drainage.   Temperature is 97.1, pulse 60, respiratory rate is 16, blood pressure  120/84.  Pupils are equal, round and reactive to light. Extra-ocular motion is  normal. There is no evidence of conjunctivitis or chemosis at this time.  Minor scleral scarring is present in the OS.  The right nares is slightly erythematous and  boggy, but is not  obstructed.  There is marked erythema of the left posterior pharynx with suggestion  of a large  aphthous ulcer.  The right tympanic membrane is dull; there is hyperemia over the  ossicles on the left ear.  He has minor shotty lymphadenopathy at the neck.  He has no axillary lymphadenopathy.  Chest is clear.  The thyroid does not reveal nodules and is nontender.   Because of the significant improvement with the Biaxin, I would  recommend that he complete the full course. Should he have nasal  congestion, post-nasal drainage, I would recommend the use of a NETI  pot.   There is no evidence of MRSA at this time. I would recommend that he use  Hibiclens for handwashing.Usual pathways of transmiision of MRSA were  discussed. Magic Mouthwash will be recommended for the aphthous ulcer; 5  cc three times a day, gargled well and swallowed.   Unless there is recurrence of MRSA in the eye or on skin then more  extreme measures such as Bactroban intranasally or under nails  or  antibiotic prophylaxis would not be pursued.     Darrick Penna. Linna Darner, MD,FACP,FCCP  Electronically Signed    [facility]/MedQ  DD: 08/17/2006  DT: 08/17/2006  Job #: [id number]

## 2010-10-14 ENCOUNTER — Other Ambulatory Visit: Payer: Self-pay | Admitting: Gastroenterology

## 2010-10-14 NOTE — Telephone Encounter (Signed)
NEEDS OFFICE VISIT.

## 2010-11-03 ENCOUNTER — Other Ambulatory Visit: Payer: Self-pay | Admitting: Internal Medicine

## 2011-01-03 ENCOUNTER — Other Ambulatory Visit: Payer: Self-pay | Admitting: Gastroenterology

## 2011-01-11 ENCOUNTER — Other Ambulatory Visit: Payer: Self-pay | Admitting: Gastroenterology

## 2011-01-12 MED ORDER — MESALAMINE 400 MG PO TBEC: 1200.0000 mg | DELAYED_RELEASE_TABLET | Freq: Two times a day (BID) | ORAL | Status: DC

## 2011-01-12 NOTE — Telephone Encounter (Signed)
Informed patient that I sent his Asacol to his pharmacy but he must keep his appointment for any further refills. Pt agreed and verbalized understanding.

## 2011-01-26 ENCOUNTER — Other Ambulatory Visit: Payer: Self-pay | Admitting: Internal Medicine

## 2011-01-26 ENCOUNTER — Ambulatory Visit (INDEPENDENT_AMBULATORY_CARE_PROVIDER_SITE_OTHER): Payer: BC Managed Care – PPO | Admitting: Gastroenterology

## 2011-01-26 ENCOUNTER — Encounter: Payer: Self-pay | Admitting: Gastroenterology

## 2011-01-26 VITALS — BP 112/78 | HR 72 | Ht 72.0 in | Wt 183.4 lb

## 2011-01-26 DIAGNOSIS — K5289 Other specified noninfective gastroenteritis and colitis: Secondary | ICD-10-CM

## 2011-01-26 MED ORDER — LEVOTHYROXINE SODIUM 75 MCG PO TABS: ORAL_TABLET | ORAL | Status: DC

## 2011-01-26 MED ORDER — MESALAMINE 400 MG PO TBEC: 1200.0000 mg | DELAYED_RELEASE_TABLET | Freq: Two times a day (BID) | ORAL | Status: DC

## 2011-01-26 NOTE — Patient Instructions (Addendum)
We have sent the following medications to your pharmacy for you to pick up at your convenience: Asacol Please return to see Dr Fuller Plan in 6 months. CC: Dr Unice Cobble

## 2011-01-26 NOTE — Telephone Encounter (Signed)
Patient needs to schedule TSH 244.9

## 2011-01-26 NOTE — Progress Notes (Signed)
History of Present Illness: This is a 45 year old male with colitis, inflammatory bowel disease. His disease is well controlled on Asacol. He states that he had side effects from Lialda leading to "looser and slimy" stools. Denies weight loss, abdominal pain, constipation, diarrhea, change in stool caliber, melena, hematochezia, nausea, vomiting, dysphagia, reflux symptoms, chest pain.  Current Medications, Allergies, Past Medical History, Past Surgical History, Family History and Social History were reviewed in [employer] record.  Physical Exam: General: Well developed , well nourished, no acute distress Head: Normocephalic and atraumatic Eyes:  sclerae anicteric, EOMI Ears: Normal auditory acuity Mouth: No deformity or lesions Lungs: Clear throughout to auscultation Heart: Regular rate and rhythm; no murmurs, rubs or bruits Abdomen: Soft, non tender and non distended. No masses, hepatosplenomegaly or hernias noted. Normal Bowel sounds Musculoskeletal: Symmetrical with no gross deformities  Pulses:  Normal pulses noted Extremities: No clubbing, cyanosis, edema or deformities noted Neurological: Alert oriented x 4, grossly nonfocal Psychological:  Alert and cooperative. Normal mood and affect  Assessment and Recommendations:  1. Colitis, inflammatory bowel disease. Type uncertain. Continue Asacol 1.2 g twice a day. Patient states he has blood work scheduled with Dr. Linna Darner scheduled in one month.  Will evaluate his renal function at that time. Return to office in 6 months

## 2011-03-03 ENCOUNTER — Other Ambulatory Visit: Payer: Self-pay | Admitting: Internal Medicine

## 2011-04-18 ENCOUNTER — Other Ambulatory Visit: Payer: Self-pay | Admitting: Internal Medicine

## 2011-04-18 NOTE — Telephone Encounter (Signed)
TSH 244.9

## 2011-05-24 ENCOUNTER — Other Ambulatory Visit: Payer: Self-pay | Admitting: Internal Medicine

## 2011-07-01 ENCOUNTER — Other Ambulatory Visit: Payer: Self-pay | Admitting: Internal Medicine

## 2011-08-09 ENCOUNTER — Other Ambulatory Visit: Payer: Self-pay | Admitting: Internal Medicine

## 2011-08-11 ENCOUNTER — Other Ambulatory Visit (INDEPENDENT_AMBULATORY_CARE_PROVIDER_SITE_OTHER): Payer: BC Managed Care – PPO

## 2011-08-11 DIAGNOSIS — E039 Hypothyroidism, unspecified: Secondary | ICD-10-CM

## 2011-08-11 DIAGNOSIS — Z0000 Encounter for general adult medical examination without abnormal findings: Secondary | ICD-10-CM

## 2011-08-11 LAB — HEPATIC FUNCTION PANEL
ALT: 21 U/L (ref 0–53)
AST: 23 U/L (ref 0–37)
Albumin: 4.1 g/dL (ref 3.5–5.2)
Alkaline Phosphatase: 52 U/L (ref 39–117)
Bilirubin, Direct: 0.1 mg/dL (ref 0.0–0.3)
Total Protein: 7.1 g/dL (ref 6.0–8.3)

## 2011-08-11 LAB — PSA: PSA: 0.54 ng/mL (ref 0.10–4.00)

## 2011-08-11 LAB — CBC WITH DIFFERENTIAL/PLATELET
Basophils Relative: 0.3 % (ref 0.0–3.0)
Eosinophils Relative: 6.9 % — ABNORMAL HIGH (ref 0.0–5.0)
Hemoglobin: 14.3 g/dL (ref 13.0–17.0)
Lymphs Abs: 0.9 10*3/uL (ref 0.7–4.0)
Monocytes Absolute: 0.3 10*3/uL (ref 0.1–1.0)
Neutrophils Relative %: 73.2 % (ref 43.0–77.0)

## 2011-08-11 LAB — LIPID PANEL
Cholesterol: 141 mg/dL (ref 0–200)
LDL Cholesterol: 82 mg/dL (ref 0–99)
Triglycerides: 109 mg/dL (ref 0.0–149.0)

## 2011-08-11 LAB — BASIC METABOLIC PANEL
BUN: 21 mg/dL (ref 6–23)
Calcium: 9.5 mg/dL (ref 8.4–10.5)
Creatinine, Ser: 1.1 mg/dL (ref 0.4–1.5)
GFR: 80.8 mL/min (ref >60.00)

## 2011-08-23 ENCOUNTER — Encounter: Payer: Self-pay | Admitting: Internal Medicine

## 2011-08-23 ENCOUNTER — Ambulatory Visit (INDEPENDENT_AMBULATORY_CARE_PROVIDER_SITE_OTHER): Payer: BC Managed Care – PPO | Admitting: Internal Medicine

## 2011-08-23 VITALS — HR 72 | Temp 98.1°F | Resp 12 | Ht 71.08 in | Wt 182.2 lb

## 2011-08-23 DIAGNOSIS — Z0000 Encounter for general adult medical examination without abnormal findings: Secondary | ICD-10-CM

## 2011-08-23 DIAGNOSIS — E039 Hypothyroidism, unspecified: Secondary | ICD-10-CM

## 2011-08-23 MED ORDER — LEVOTHYROXINE SODIUM 75 MCG PO TABS: ORAL_TABLET | ORAL | Status: DC

## 2011-08-23 NOTE — Patient Instructions (Signed)
Preventive Health Care: Exercise at least 30-45 minutes a day,  3-4 days a week.  Eat a low-fat diet with lots of fruits and vegetables, up to 7-9 servings per day. Consume less than 40 grams of sugar per day from foods & drinks with High Fructose Corn Sugar as # 1,2,3 or # 4 on label. Health Care Power of [place]. Complete if not in place ; these place you in charge of your health care decisions. To prevent palpitations or premature beats, avoid stimulants such as decongestants, diet pills, nicotine, or caffeine (coffee, tea, cola, or chocolate) to excess.

## 2011-08-23 NOTE — Progress Notes (Signed)
  Subjective:    Patient ID: Tanner White, male    DOB: 01-16-1966, 46 y.o.   MRN: [id number]  HPI  Mr Polyak  is here for a physical; he denies acute issues .      Review of Systems Patient reports no  vision/ hearing changes,anorexia, weight change, fever ,adenopathy, persistant / recurrent hoarseness, swallowing issues, chest pain,palpitations, edema,persistant / recurrent cough, hemoptysis, dyspnea(rest, exertional, paroxysmal nocturnal), gastrointestinal  bleeding (melena, rectal bleeding), abdominal pain, excessive heart burn, GU symptoms( dysuria, hematuria, pyuria, voiding/incontinence  issues) syncope, focal weakness, memory loss, skin/hair/nail changes,depression, abnormal bruising/bleeding,or  musculoskeletal symptoms/signs.  Occasionally he will have numbness &  tingling around one or other eye which would last 24- 48  hours.  He notices occasional discomfort in the left testicle which he attributes to the varices.     Objective:   Physical Exam Gen.: Thin but healthy and well-nourished in appearance. Alert, appropriate and cooperative throughout exam. Head: Normocephalic without obvious abnormalities;  no alopecia  Eyes: No corneal or conjunctival inflammation noted. Pupils equal round reactive to light and accommodation. Fundal exam is benign without hemorrhages, exudate, papilledema. Extraocular motion intact. Vision grossly normal. Ears: External  ear exam reveals no significant lesions or deformities. Canals clear .TMs normal. Hearing is grossly normal bilaterally. Nose: External nasal exam reveals no deformity or inflammation. Nasal mucosa are pink and moist. No lesions or exudates noted.  Mouth: Oral mucosa and oropharynx reveal no lesions or exudates. Teeth in good repair. Neck: No deformities, masses, or tenderness noted. Range of motion & Thyroid notmal Lungs: Normal respiratory effort; chest expands symmetrically. Lungs are clear to auscultation without rales,  wheezes, or increased work of breathing. Heart: Normal rate and rhythm. Normal S1 and S2. No gallop, click, or rub. S4 with slurring ; no  murmur. Abdomen: Bowel sounds normal; abdomen soft and nontender. No masses, organomegaly or hernias noted. Genitalia/ DRE: Small granulomas present bilaterally. Small varicocele on the left.Prostate is normal without enlargement, asymmetry, nodularity, or induration.  Musculoskeletal/extremities: No deformity or scoliosis noted of  the thoracic or lumbar spine. No clubbing, cyanosis, edema, or deformity noted. Range of motion  normal .Tone & strength  normal.Joints normal. Nail health  good. Vascular: Carotid, radial artery, dorsalis pedis and  posterior tibial pulses are full and equal. No bruits present. Neurologic: Alert and oriented x3. Deep tendon reflexes symmetrical and normal.          Skin: Intact without suspicious lesions or rashes. Lymph: No cervical, axillary, or inguinal lymphadenopathy present. Psych: Mood and affect are normal. Normally interactive                                                                                         Assessment & Plan:  #1 comprehensive physical exam; no acute findings #2 see Problem List with Assessments & Recommendations Plan: see Orders

## 2011-09-06 ENCOUNTER — Telehealth: Payer: Self-pay

## 2011-09-06 MED ORDER — MESALAMINE 400 MG PO CPDR: 3.0000 | DELAYED_RELEASE_CAPSULE | Freq: Two times a day (BID) | ORAL | Status: DC

## 2011-09-06 NOTE — Telephone Encounter (Signed)
Asacol HD 840m 2 po bid or Delzicol 4052m3 po bid

## 2011-09-06 NOTE — Telephone Encounter (Signed)
Sent in [place] to patient's pharmacy.

## 2012-01-10 ENCOUNTER — Encounter: Payer: Self-pay | Admitting: Gastroenterology

## 2012-05-04 ENCOUNTER — Telehealth: Payer: Self-pay | Admitting: Gastroenterology

## 2012-05-04 ENCOUNTER — Encounter: Payer: Self-pay | Admitting: Gastroenterology

## 2012-05-04 MED ORDER — MESALAMINE ER 0.375 G PO CP24: 1500.0000 mg | ORAL_CAPSULE | ORAL | Status: DC

## 2012-05-04 NOTE — Telephone Encounter (Signed)
Apriso Rx sent for 1 month. REV scheduled for 05/30/12 @ 9:15 am. Patient aware and verbalizes understanding.

## 2012-05-04 NOTE — Telephone Encounter (Signed)
One month of Apriso 4 po qam REV within one month

## 2012-05-04 NOTE — Telephone Encounter (Signed)
Dr Fuller Plan, Patient calls stating that his Delzicol will no longer be covered under his insurance plan. Insurance will cover [employer]. However, patient was to follow up with you 6 months from his last visit 01-2011 (colitis) but never did. He states that he has only been taking "half the delzicol prescription" because he is using it as a maintenance medication. Patient advised he needs a routine follow up office visit. He has not done this but is scheduled for a recall colonoscopy on 07/06/12. Does he need an office visit before colonoscopy? Do you want me to send an Apriso prescription?

## 2012-05-30 ENCOUNTER — Encounter: Payer: Self-pay | Admitting: Gastroenterology

## 2012-05-30 ENCOUNTER — Ambulatory Visit (INDEPENDENT_AMBULATORY_CARE_PROVIDER_SITE_OTHER): Payer: 59 | Admitting: Gastroenterology

## 2012-05-30 VITALS — BP 104/74 | HR 80 | Ht 72.0 in | Wt 184.6 lb

## 2012-05-30 DIAGNOSIS — K5150 Left sided colitis without complications: Secondary | ICD-10-CM | POA: Insufficient documentation

## 2012-05-30 NOTE — Patient Instructions (Addendum)
Resume your Apriso at current dose.   Please keep your upcoming appointments for your Previsit and Colonoscopy.

## 2012-05-30 NOTE — Progress Notes (Signed)
History of Present Illness: This is a 47 year old male with left-sided ulcerative colitis diagnosed in 2008. For the past several months he's been managed with Delzicol however he decreased the dosage to half of the recommended dosage. Apriso was recently tried which lead to pasty stools so he has temporarily discontinued it. Lialda lead to slimy and mucousy stools so he discontinued it.   Current Medications, Allergies, Past Medical History, Past Surgical History, Family History and Social History were reviewed in [employer] record.  Physical Exam: General: Well developed , well nourished, no acute distress Head: Normocephalic and atraumatic Eyes:  sclerae anicteric, EOMI Ears: Normal auditory acuity Mouth: No deformity or lesions Lungs: Clear throughout to auscultation Heart: Regular rate and rhythm; no murmurs, rubs or bruits Abdomen: Soft, non tender and non distended. No masses, hepatosplenomegaly or hernias noted. Normal Bowel sounds Musculoskeletal: Symmetrical with no gross deformities  Pulses:  Normal pulses noted Extremities: No clubbing, cyanosis, edema or deformities noted Neurological: Alert oriented x 4, grossly nonfocal Psychological:  Alert and cooperative. Normal mood and affect  Assessment and Recommendations:  1. Left sided ulcerative colitis. We had long discussion about the natural history of ulcerative colitis and the benefits of long term suppression of inflammation. He will resume Apriso at the recommended dosage. Colonoscopy scheduled for February to reassess his disease.

## 2012-06-22 ENCOUNTER — Ambulatory Visit (AMBULATORY_SURGERY_CENTER): Payer: 59

## 2012-06-22 DIAGNOSIS — K5190 Ulcerative colitis, unspecified, without complications: Secondary | ICD-10-CM

## 2012-06-22 MED ORDER — MOVIPREP 100 G PO SOLR: 1.0000 | Freq: Once | ORAL | Status: DC

## 2012-06-25 ENCOUNTER — Encounter: Payer: Self-pay | Admitting: Gastroenterology

## 2012-07-06 ENCOUNTER — Encounter: Payer: Self-pay | Admitting: Gastroenterology

## 2012-07-06 ENCOUNTER — Ambulatory Visit (AMBULATORY_SURGERY_CENTER): Payer: 59 | Admitting: Gastroenterology

## 2012-07-06 VITALS — BP 109/45 | HR 59 | Temp 96.6°F | Resp 43 | Ht 72.0 in | Wt 184.0 lb

## 2012-07-06 DIAGNOSIS — K5289 Other specified noninfective gastroenteritis and colitis: Secondary | ICD-10-CM

## 2012-07-06 DIAGNOSIS — D126 Benign neoplasm of colon, unspecified: Secondary | ICD-10-CM

## 2012-07-06 MED ORDER — SODIUM CHLORIDE 0.9 % IV SOLN: 500.0000 mL | INTRAVENOUS | Status: DC

## 2012-07-06 NOTE — Progress Notes (Signed)
Patient did not experience any of the following events: a burn prior to discharge; a fall within the facility; wrong site/side/patient/procedure/implant event; or a hospital transfer or hospital admission upon discharge from the facility. (G8907)Patient did not have preoperative order for IV antibiotic SSI prophylaxis. (G8918)Patient did not have preoperative order for IV antibiotic SSI prophylaxis. [id number])

## 2012-07-06 NOTE — Progress Notes (Signed)
Called to room to assist during endoscopic procedure.  Patient ID and intended procedure confirmed with present staff. Received instructions for my participation in the procedure from the performing physician.  

## 2012-07-06 NOTE — Patient Instructions (Addendum)
Impressions/recommendations:  Abnormal mucosa, await pathology results.  Repeat colonoscopy in 3 years.  YOU HAD AN ENDOSCOPIC PROCEDURE TODAY AT [facility]: Refer to the procedure report that was given to you for any specific questions about what was found during the examination.  If the procedure report does not answer your questions, please call your gastroenterologist to clarify.  If you requested that your care partner not be given the details of your procedure findings, then the procedure report has been included in a sealed envelope for you to review at your convenience later.  YOU SHOULD EXPECT: Some feelings of bloating in the abdomen. Passage of more gas than usual.  Walking can help get rid of the air that was put into your GI tract during the procedure and reduce the bloating. If you had a lower endoscopy (such as a colonoscopy or flexible sigmoidoscopy) you may notice spotting of blood in your stool or on the toilet paper. If you underwent a bowel prep for your procedure, then you may not have a normal bowel movement for a few days.  DIET: Your first meal following the procedure should be a light meal and then it is ok to progress to your normal diet.  A half-sandwich or bowl of soup is an example of a good first meal.  Heavy or fried foods are harder to digest and may make you feel nauseous or bloated.  Likewise meals heavy in dairy and vegetables can cause extra gas to form and this can also increase the bloating.  Drink plenty of fluids but you should avoid alcoholic beverages for 24 hours.  ACTIVITY: Your care partner should take you home directly after the procedure.  You should plan to take it easy, moving slowly for the rest of the day.  You can resume normal activity the day after the procedure however you should NOT DRIVE or use heavy machinery for 24 hours (because of the sedation medicines used during the test).    SYMPTOMS TO REPORT IMMEDIATELY: A  gastroenterologist can be reached at any hour.  During normal business hours, 8:30 AM to 5:00 PM Monday through Friday, call [phone].  After hours and on weekends, please call the GI answering service at [phone] who will take a message and have the physician on call contact you.   Following lower endoscopy (colonoscopy or flexible sigmoidoscopy):  Excessive amounts of blood in the stool  Significant tenderness or worsening of abdominal pains  Swelling of the abdomen that is new, acute  Fever of 100F or higher   FOLLOW UP: If any biopsies were taken you will be contacted by phone or by letter within the next 1-3 weeks.  Call your gastroenterologist if you have not heard about the biopsies in 3 weeks.  Our staff will call the home number listed on your records the next business day following your procedure to check on you and address any questions or concerns that you may have at that time regarding the information given to you following your procedure. This is a courtesy call and so if there is no answer at the home number and we have not heard from you through the emergency physician on call, we will assume that you have returned to your regular daily activities without incident.  SIGNATURES/CONFIDENTIALITY: You and/or your care partner have signed paperwork which will be entered into your electronic medical record.  These signatures attest to the fact that that the information above on your After Visit  Summary has been reviewed and is understood.  Full responsibility of the confidentiality of this discharge information lies with you and/or your care-partner.

## 2012-07-06 NOTE — Op Note (Signed)
[place]  [address] [place], [id number]   COLONOSCOPY PROCEDURE REPORT  PATIENT: Tanner, White  MR#: [id number] BIRTHDATE: [birth year] , 46  yrs. old GENDER: Male ENDOSCOPIST: Ladene Artist, MD, [facility] PROCEDURE DATE:  07/06/2012 PROCEDURE:   Colonoscopy with biopsy ASA CLASS:   Class II INDICATIONS:elevated risk screening and high risk patient with previously diagnosed UC left-sided colitis. MEDICATIONS: MAC sedation, administered by CRNA and propofol (Diprivan) 263m IV DESCRIPTION OF PROCEDURE:   After the risks benefits and alternatives of the procedure were thoroughly explained, informed consent was obtained.  A digital rectal exam revealed no abnormalities of the rectum.   The LB CF-[id number] endoscope was introduced through the anus and advanced to the cecum, which was identified by both the appendix and ileocecal valve. No adverse events experienced.   The quality of the prep was good, using MoviPrep  The instrument was then slowly withdrawn as the colon was fully examined.  COLON FINDINGS: Abnormal mucosa was found in the descending colon, sigmoid colon, and rectum.  The mucosa had pathcy granularity and erythema.  Multiple random biopsies of the area were performed. The colon was otherwise normal.  There was no diverticulosis, inflammation, polyps or cancers unless previously stated.  Multiple random biopsies or the area were performed. Retroflexed views revealed no abnormalities. The time to cecum=1 minutes 58 seconds. Withdrawal time=7 minutes 56 seconds.  The scope was withdrawn and the procedure completed.  COMPLICATIONS: There were no complications.  ENDOSCOPIC IMPRESSION: 1.   Abnormal mucosa was found in the left colon; multiple random biopsies of the area were performed 2.   The colon was otherwise normal  RECOMMENDATIONS: 1.  Await pathology results 2.  Repeat Colonoscopy in 3 years.   eSigned:  MLadene Artist MD,  [facility]02/21/2014 9:00 AM

## 2012-07-09 ENCOUNTER — Telehealth: Payer: Self-pay | Admitting: *Deleted

## 2012-07-09 ENCOUNTER — Telehealth: Payer: Self-pay

## 2012-07-09 NOTE — Telephone Encounter (Signed)
  Follow up Call-  Call back number 07/06/2012  Post procedure Call Back phone  # [phone]  Permission to leave phone message Yes     Patient questions:  Do you have a fever, pain , or abdominal swelling? no Pain Score  0 *  Have you tolerated food without any problems? yes  Have you been able to return to your normal activities? yes  Do you have any questions about your discharge instructions: Diet   no Medications  no Follow up visit  no  Do you have questions or concerns about your Care? no  Actions: * If pain score is 4 or above: No action needed, pain <4.

## 2012-07-09 NOTE — Telephone Encounter (Signed)
Message copied by Marzella Schlein on Mon Jul 09, 2012  8:17 AM ------      Message from: Lucio Edward T      Created: Fri Jul 06, 2012 10:13 AM       This pt had colonoscopy today and needs a 5 ASA med long term for left sided UC. He was Dezicol which he says is no longer covered. Says Apriso made his stools slimy. Please help him find a 5ASA that is covered and that he likes. ------

## 2012-07-09 NOTE — Telephone Encounter (Signed)
Told patient to call his insurance company to find out what the preferred 5 ASA medication is now through his insurance. Pt states he can go online and to his insurance page and it gives him a list of 8 medications. Patient states he can rule out 4-5 that he has either tried and failed or does not do well with. Told him to call me back with a list of a few medications that he has not tried so we can start him on one. Pt agreed and will call me back with an answer.

## 2012-07-11 ENCOUNTER — Encounter: Payer: Self-pay | Admitting: Gastroenterology

## 2012-07-27 ENCOUNTER — Other Ambulatory Visit: Payer: Self-pay | Admitting: Internal Medicine

## 2012-07-30 NOTE — Telephone Encounter (Signed)
TSH 244.9

## 2012-08-23 ENCOUNTER — Other Ambulatory Visit: Payer: Self-pay | Admitting: Internal Medicine

## 2012-10-24 ENCOUNTER — Ambulatory Visit (INDEPENDENT_AMBULATORY_CARE_PROVIDER_SITE_OTHER): Payer: 59 | Admitting: Internal Medicine

## 2012-10-24 ENCOUNTER — Encounter: Payer: Self-pay | Admitting: Internal Medicine

## 2012-10-24 VITALS — BP 112/78 | HR 54 | Temp 97.9°F | Wt 184.0 lb

## 2012-10-24 DIAGNOSIS — E079 Disorder of thyroid, unspecified: Secondary | ICD-10-CM

## 2012-10-24 DIAGNOSIS — R0789 Other chest pain: Secondary | ICD-10-CM

## 2012-10-24 DIAGNOSIS — R946 Abnormal results of thyroid function studies: Secondary | ICD-10-CM

## 2012-10-24 DIAGNOSIS — R42 Dizziness and giddiness: Secondary | ICD-10-CM

## 2012-10-24 LAB — BASIC METABOLIC PANEL
BUN: 15 mg/dL (ref 6–23)
CO2: 27 mEq/L (ref 19–32)
Chloride: 103 mEq/L (ref 96–112)
Creatinine, Ser: 1.1 mg/dL (ref 0.4–1.5)
Glucose, Bld: 78 mg/dL (ref 70–99)
Potassium: 4 mEq/L (ref 3.5–5.1)

## 2012-10-24 MED ORDER — MECLIZINE HCL 25 MG PO TABS: 25.0000 mg | ORAL_TABLET | Freq: Three times a day (TID) | ORAL | Status: DC | PRN

## 2012-10-24 NOTE — Patient Instructions (Addendum)
If you activate the  My Chart system; lab & Xray results will be released directly  to you as soon as I review & address these through the computer. If you choose not to sign up for My Chart within 36 hours of labs being drawn; results will be reviewed & interpretation added before being copied & mailed, causing a delay in getting the results to you.If you do not receive that report within 7-10 days ,please call. Additionally you can use this system to gain direct  access to your records  if  out of town or @ an office of a  physician who is not in  the My Chart network.  This improves continuity of care & places you in control of your medical record.

## 2012-10-24 NOTE — Progress Notes (Signed)
Subjective:    Patient ID: Tanner White, male    DOB: 22-Aug-1965, 47 y.o.   MRN: [id number]  HPI   Intermittently over the last 2 months he has had a sensation of vertigo;"like being sea sick".This can last several hours.  He's also noted possible heart palpitations or extra beats and possibly an irregular heart rhythm.  Intermittently he's had sinus pressure; some hearing loss; a swishing sensation with his heartbeat in his ears and some tinnitus especially in the right ear. There he's had some blurred vision. Remotely he had some double vision. He is having occasional headache. He's also noted some anxiety. None of these positive symptoms are definitely directly related to the symptoms of vertigo.  The vertigo has not been related to position change or benign positional vertigo symptoms. It is not related to change in position of his neck or head. It is also not brought on by straining or lifting or any pain syndrome.  He's had no neurologic or Cardiologic prodrome prior to the vertigo. He's had no associated seizure activity or loss of consciousness.     Review of Systems  Intermittently he's had a "shock" discomfort in the left upper chest with some nausea. This is nonradiating and nonexertional. It improves with deep inspiration.  He rides a bike 2 X/ week X 12-15 miles w/o symptoms. He does not have significant lightheadedness; near syncope; loss of vision; acute shortness of breath; hemoptysis; change in coordination; limb numbness or tingling; or weakness in his arms or legs.      Objective:   Physical Exam Gen.: Thin but healthy and well-nourished in appearance. Alert, appropriate and cooperative throughout exam.Appears younger than stated age  Head: Normocephalic without obvious abnormalities;no alopecia  Eyes: No corneal or conjunctival inflammation noted.  Extraocular motion intact. Vision grossly decreased OS without lenses.FOV normal ; no nystagmus present.  Ears:  External  ear exam reveals no significant lesions or deformities. Canals clear .TMs normal. Hearing is grossly normal bilaterally.Tuning fork exam normal.  Nose: External nasal exam reveals no deformity or inflammation. Nasal mucosa are pink and moist. No lesions or exudates noted. Septum  To R  Mouth: Oral mucosa and oropharynx reveal no lesions or exudates. Teeth in good repair. Neck: No deformities, masses, or tenderness noted. Range of motion normal. Thyroid ? enlarged , firm L lobe. Lungs: Normal respiratory effort; chest expands symmetrically. Lungs are clear to auscultation without rales, wheezes, or increased work of breathing. Heart: Slow rate and regular rhythm. Normal S1 and S2. No gallop, click, or rub. No  murmur.                               Musculoskeletal/extremities: No deformity or scoliosis noted of  the thoracic or lumbar spine.  No clubbing, cyanosis, edema, or significant extremity  deformity noted. Range of motion normal .Tone & strength  Normal. Joints normal. Nail health good. Able to lie down & sit up w/o help. Negative SLR bilaterally Vascular: Carotid, radial artery, dorsalis pedis and  posterior tibial pulses are full and equal. No bruits present. Neurologic: Alert and oriented x3. Deep tendon reflexes symmetrical and normal. Gait  normal.No tremor.Rhomberg & finger to nose testing normal.       Skin: Intact without suspicious lesions or rashes. Lymph: No cervical, axillary lymphadenopathy present. Psych: Mood and affect are normal. Normally interactive  Assessment & Plan:  #1 vertigo  #2 atypical chest pain; EKG normal. Incomplete right bundle branch block of no significance.  #3 thyroid asymmetry, rule out nodule  Plan: See orders recommendations

## 2012-10-29 ENCOUNTER — Ambulatory Visit
Admission: RE | Admit: 2012-10-29 | Discharge: 2012-10-29 | Disposition: A | Discharge status: Home / Self Care | Payer: 59 | Source: Ambulatory Visit | Attending: Internal Medicine | Admitting: Internal Medicine

## 2012-10-29 DIAGNOSIS — R946 Abnormal results of thyroid function studies: Secondary | ICD-10-CM

## 2012-10-31 ENCOUNTER — Other Ambulatory Visit: Payer: 59

## 2012-10-31 ENCOUNTER — Encounter: Payer: Self-pay | Admitting: Internal Medicine

## 2012-10-31 DIAGNOSIS — E041 Nontoxic single thyroid nodule: Secondary | ICD-10-CM

## 2012-11-05 ENCOUNTER — Ambulatory Visit: Payer: 59 | Admitting: Internal Medicine

## 2012-11-06 ENCOUNTER — Ambulatory Visit: Payer: 59 | Admitting: Internal Medicine

## 2012-11-07 ENCOUNTER — Ambulatory Visit
Admission: RE | Admit: 2012-11-07 | Discharge: 2012-11-07 | Disposition: A | Discharge status: Home / Self Care | Payer: 59 | Source: Ambulatory Visit | Attending: Internal Medicine | Admitting: Internal Medicine

## 2012-11-07 DIAGNOSIS — E041 Nontoxic single thyroid nodule: Secondary | ICD-10-CM

## 2012-11-07 HISTORY — PX: BIOPSY THYROID: PRO38

## 2012-11-09 ENCOUNTER — Encounter: Payer: Self-pay | Admitting: *Deleted

## 2012-11-13 ENCOUNTER — Telehealth: Payer: Self-pay | Admitting: Gastroenterology

## 2012-11-13 MED ORDER — MESALAMINE 800 MG PO TBEC: 1.0000 | DELAYED_RELEASE_TABLET | Freq: Two times a day (BID) | ORAL | Status: DC

## 2012-11-13 NOTE — Telephone Encounter (Signed)
Patient states his insurance covers Asacol HD now and he would like to start on this medication. Asked patient if he has been on 5 ASA medication for his UC and patient states "not really". Patient states he was doing relatively well in February and didn't need to be on a medication all the time. Told him that is what Dr. Fuller Plan recommended. Patient states he is starting to have symptoms now of cramping abdominal pain and diarrhea. He states he would like to be back on Asacol since it has worked well in the past and his insurance covers it now. Told him Dr. Fuller Plan is out of town til Monday but I told patient that I would ask Doc of the day to check on what dosage he should start on.

## 2012-11-13 NOTE — Telephone Encounter (Signed)
800 mg bid

## 2012-11-13 NOTE — Telephone Encounter (Signed)
Told patient that I will send Asacol to his pharmacy and also send this note to Dr. Fuller Plan for Monday when he returns just in case he wants him to be on a different dose or if he needs a follow up visit.

## 2012-11-14 ENCOUNTER — Other Ambulatory Visit: Payer: Self-pay | Admitting: Internal Medicine

## 2012-11-14 ENCOUNTER — Telehealth: Payer: Self-pay | Admitting: *Deleted

## 2012-11-14 DIAGNOSIS — E039 Hypothyroidism, unspecified: Secondary | ICD-10-CM

## 2012-11-14 DIAGNOSIS — E049 Nontoxic goiter, unspecified: Secondary | ICD-10-CM | POA: Insufficient documentation

## 2012-11-14 MED ORDER — LEVOTHYROXINE SODIUM 100 MCG PO TABS: 100.0000 ug | ORAL_TABLET | Freq: Every day | ORAL | Status: DC

## 2012-11-14 NOTE — Telephone Encounter (Signed)
Spoke with patient, made aware of changes and location of called in RX. Pt. Verbalized understanding.

## 2012-11-14 NOTE — Telephone Encounter (Signed)
Change thyroid dose to 100 mcg daily, #90. Recheck TSH in 10 weeks. We want to suppress the TSH to prevent thyroid enlargement or thyroid nodules.

## 2012-11-14 NOTE — Telephone Encounter (Signed)
Pt. Called in regarding TSH results. States he received the letter regarding his most recent TSH of 4.72. Pt. States he needs a refill on his synthroid but was unaware of any dosage changes that need to be made if any. Please clarify. Thanks.

## 2012-11-27 ENCOUNTER — Encounter: Payer: Self-pay | Admitting: Gastroenterology

## 2012-11-28 ENCOUNTER — Ambulatory Visit (INDEPENDENT_AMBULATORY_CARE_PROVIDER_SITE_OTHER): Payer: 59 | Admitting: Physician Assistant

## 2012-11-28 ENCOUNTER — Other Ambulatory Visit (INDEPENDENT_AMBULATORY_CARE_PROVIDER_SITE_OTHER): Payer: 59

## 2012-11-28 ENCOUNTER — Encounter: Payer: Self-pay | Admitting: Physician Assistant

## 2012-11-28 VITALS — BP 100/70 | HR 68 | Ht 72.0 in | Wt 182.0 lb

## 2012-11-28 DIAGNOSIS — K51919 Ulcerative colitis, unspecified with unspecified complications: Secondary | ICD-10-CM

## 2012-11-28 DIAGNOSIS — K5150 Left sided colitis without complications: Secondary | ICD-10-CM

## 2012-11-28 DIAGNOSIS — K5190 Ulcerative colitis, unspecified, without complications: Secondary | ICD-10-CM

## 2012-11-28 LAB — CBC WITH DIFFERENTIAL/PLATELET
Basophils Relative: 0.6 % (ref 0.0–3.0)
Eosinophils Relative: 4.3 % (ref 0.0–5.0)
HCT: 40.1 % (ref 39.0–52.0)
Hemoglobin: 13.5 g/dL (ref 13.0–17.0)
Lymphocytes Relative: 21.1 % (ref 12.0–46.0)
Lymphs Abs: 1.2 10*3/uL (ref 0.7–4.0)
Monocytes Relative: 8.7 % (ref 3.0–12.0)
Neutro Abs: 3.6 10*3/uL (ref 1.4–7.7)
RBC: 4.42 Mil/uL (ref 4.22–5.81)
RDW: 13 % (ref 11.5–14.6)
WBC: 5.6 10*3/uL (ref 4.5–10.5)

## 2012-11-28 MED ORDER — MESALAMINE 800 MG PO TBEC: DELAYED_RELEASE_TABLET | ORAL | Status: DC

## 2012-11-28 NOTE — Patient Instructions (Addendum)
We sent a new prescription for the Asachol HD , Take 3 tab twice daily.. We have given you samples and a [employer].  Use this when picking up the Asacol prescription. Call us in two weeks if you symptoms are not better.   We have made you an office appointment with Dr. Fuller Plan on 01-02-2013 at 2:15 PM.

## 2012-11-28 NOTE — Progress Notes (Signed)
Subjective:    Patient ID: Tanner White, male    DOB: 29-Nov-1965, 47 y.o.   MRN: [id number]  HPI Tanner White is a pleasant 47 year old white male known to Dr. Fuller Plan who has history of left-sided ulcerative colitis area he was last seen in January and at that time was not having any symptoms. He underwent colonoscopy in February 2014 which did show a mild to moderate bleed active left sided colitis. Biopsies confirmed moderately chronic active colitis without dysplasia. He was started on Asacol HD at that time but has only been taking one tablet twice daily. He says since that time he feels that his symptoms have very gradually worsened and he is now having about 8 bowel movements per day with loose somewhat slimy urgent stools. He has not seen blood on any  regular basis. He does have some intermittent mild abdominal cramping. He also mentions that he has been feeling a bit fatigued.   Review of Systems  Constitutional: Positive for fatigue.  HENT: Negative.   Eyes: Negative.   Respiratory: Negative.   Cardiovascular: Negative.   Gastrointestinal: Negative.   Endocrine: Negative.   Genitourinary: Negative.   Musculoskeletal: Negative.   Skin: Negative.   Allergic/Immunologic: Negative.   Neurological: Negative.   Hematological: Negative.   Psychiatric/Behavioral: Negative.    Outpatient Prescriptions Prior to Visit  Medication Sig Dispense Refill  . levothyroxine (SYNTHROID, LEVOTHROID) 100 MCG tablet Take 1 tablet (100 mcg total) by mouth daily.  90 tablet  0  . Sulfacetamide-Sulfur-Sunscreen (PRASCION RA) 10-5 % CREA Apply topically daily.      . Mesalamine (ASACOL HD) 800 MG TBEC Take 1 tablet (800 mg total) by mouth 2 (two) times daily.  60 tablet  1  . meclizine (ANTIVERT) 25 MG tablet Take 1 tablet (25 mg total) by mouth 3 (three) times daily as needed.  30 tablet  1   No facility-administered medications prior to visit.       Allergies  Allergen Reactions  . Doxycycline      hives   Patient Active Problem List   Diagnosis Date Noted  . Non-toxic nodular goiter 11/14/2012  . Left sided ulcerative (chronic) colitis 05/30/2012  . ROSACEA 07/21/2010  . TINNITUS 04/08/2009  . ELEVATED BLOOD PRESSURE WITHOUT DIAGNOSIS OF HYPERTENSION 04/08/2009  . NEPHROLITHIASIS, HX OF 08/14/2007  . HYPOTHYROIDISM 03/27/2007  . COLITIS 03/27/2007   family history includes Cancer in his maternal uncle; Colon cancer in his maternal uncle; Colon cancer (age of onset: 44) in his paternal grandmother; Colon polyps in his paternal uncle; Diverticulitis in his paternal grandmother; Heart disease in his paternal uncle; Hypertension in his maternal grandfather; Hypothyroidism in his mother; Lung cancer in his maternal grandfather; Osteoarthritis in his father; and Thyroid disease in his maternal grandmother.  There is no history of Esophageal cancer, and Stomach cancer, and Rectal cancer, .  Objective:   Physical Exam well-developed white male in no acute distress, pleasant blood pressure 100/70 pulse 68 height 6 foot weight 182. HEENT nontraumatic normocephalic EOMI PERRLA sclera anicteric, Supple no JVD, Cardiovascular regular rate and rhythm with S1-S2 no murmur or gallop, Pulmonary clear bilaterally, Abdomen soft nontender nondistended bowel sounds are active there is no palpable mass or hepatosplenomegaly, Rectal exam not done, Extremities no clubbing cyanosis or edema skin warm and dry, Psych mood and affect normal and appropriate        Assessment & Plan:  #68 47 year old male with known chronic left-sided ulcerative colitis currently poorly controlled on low-dose  AsacolHD.  Plan; check CBC today Increase Asacol HD 800 mg to 3 tablets twice daily. Patient was advised that it may take several weeks to see full benefit from this- but that if he is not having any improvement or certainly if he has any worsening of his symptoms after he has increased his dose for about 2 weeks to call  and we may need to give him a course of Ulceris . We had a long discussion regarding the chronic nature of ulcerative colitis and the need to treat active inflammation He will followup with Dr. Fuller Plan in approximately one month

## 2012-11-28 NOTE — Progress Notes (Signed)
Reviewed and agree with management plan.  Bernice Mcauliffe T. Vishnu Moeller, MD FACG 

## 2012-12-06 ENCOUNTER — Encounter: Payer: Self-pay | Admitting: Physician Assistant

## 2012-12-06 ENCOUNTER — Encounter: Payer: 59 | Admitting: Internal Medicine

## 2012-12-06 ENCOUNTER — Telehealth: Payer: Self-pay | Admitting: *Deleted

## 2012-12-06 DIAGNOSIS — K5190 Ulcerative colitis, unspecified, without complications: Secondary | ICD-10-CM

## 2012-12-06 MED ORDER — BUDESONIDE 9 MG PO TB24: 9.0000 mg | ORAL_TABLET | Freq: Every day | ORAL | Status: DC

## 2012-12-06 NOTE — Telephone Encounter (Signed)
Message copied by Hulan Saas on Thu Dec 06, 2012  9:50 AM ------      Message from: Nicoletta Ba S      Created: Thu Dec 06, 2012  9:40 AM       Rollene Fare, please call this pt- I saw him for ulcerative colitis flare- I want to sart him on a course of Uceris for 8 weeks - can you get him started on samples 9 mg   Once daily in am...and call in an rx -thanks. He needs to stay on the higher dose of Asacol as well.Thanks ------

## 2012-12-08 ENCOUNTER — Other Ambulatory Visit: Payer: Self-pay | Admitting: Internal Medicine

## 2012-12-08 DIAGNOSIS — E039 Hypothyroidism, unspecified: Secondary | ICD-10-CM

## 2012-12-08 DIAGNOSIS — E049 Nontoxic goiter, unspecified: Secondary | ICD-10-CM

## 2012-12-10 ENCOUNTER — Other Ambulatory Visit: Payer: Self-pay | Admitting: *Deleted

## 2012-12-10 DIAGNOSIS — E049 Nontoxic goiter, unspecified: Secondary | ICD-10-CM

## 2012-12-10 DIAGNOSIS — E039 Hypothyroidism, unspecified: Secondary | ICD-10-CM

## 2012-12-10 MED ORDER — LEVOTHYROXINE SODIUM 100 MCG PO TABS: 100.0000 ug | ORAL_TABLET | Freq: Every day | ORAL | Status: DC

## 2012-12-10 NOTE — Telephone Encounter (Signed)
Refill for synthroid sent to [facility] in [place]

## 2012-12-10 NOTE — Telephone Encounter (Signed)
Already addressed. SG, RN

## 2013-01-02 ENCOUNTER — Encounter: Payer: Self-pay | Admitting: Gastroenterology

## 2013-01-02 ENCOUNTER — Ambulatory Visit (INDEPENDENT_AMBULATORY_CARE_PROVIDER_SITE_OTHER): Payer: 59 | Admitting: Gastroenterology

## 2013-01-02 VITALS — BP 110/70 | HR 84 | Ht 71.0 in | Wt 177.0 lb

## 2013-01-02 DIAGNOSIS — K51519 Left sided colitis with unspecified complications: Secondary | ICD-10-CM

## 2013-01-02 DIAGNOSIS — K5150 Left sided colitis without complications: Secondary | ICD-10-CM

## 2013-01-02 NOTE — Patient Instructions (Signed)
Stay on current medications. Please contact your pharmacy when you need more refills of Asacol.  Thank you for choosing me and [facility].  Pricilla Riffle. Dagoberto Ligas., MD., Marval Regal

## 2013-01-02 NOTE — Progress Notes (Signed)
History of Present Illness: This is a 47 year old male with left-sided ulcerative colitis. He was evaluated by Nicoletta Ba, PAC for recent flare and Asacol was increased to 4.8 grams per day. He was also prescribed budesonide but delayed in getting his prescription until he tried a Asacol at higher dose. He filled budesonide on August 5 when his symptoms had not improved and he has noted significant improvement since that time. He still has episodes of mild diarrhea and some fatigue. Overall he is substantially improved.  Current Medications, Allergies, Past Medical History, Past Surgical History, Family History and Social History were reviewed in [employer] record.  Physical Exam: General: Well developed , well nourished, no acute distress Head: Normocephalic and atraumatic Eyes:  sclerae anicteric, EOMI Ears: Normal auditory acuity Mouth: No deformity or lesions Lungs: Clear throughout to auscultation Heart: Regular rate and rhythm; no murmurs, rubs or bruits Abdomen: Soft, non tender and non distended. No masses, hepatosplenomegaly or hernias noted. Normal Bowel sounds Musculoskeletal: Symmetrical with no gross deformities  Pulses:  Normal pulses noted Extremities: No clubbing, cyanosis, edema or deformities noted Neurological: Alert oriented x 4, grossly nonfocal Psychological:  Alert and cooperative. Normal mood and affect  Assessment and Recommendations:  1. Left sided ulcerative colitis, recent flare improving on higher dose Asacol and budesonide 9 mg daily. Complete 2 months and budesonide 9 mg daily and remain on Asacol 4.8g daily. Return office visit in 2 months.

## 2013-01-31 ENCOUNTER — Encounter: Payer: Self-pay | Admitting: Internal Medicine

## 2013-02-05 ENCOUNTER — Other Ambulatory Visit: Payer: Self-pay | Admitting: Physician Assistant

## 2013-03-13 ENCOUNTER — Encounter: Payer: Self-pay | Admitting: Gastroenterology

## 2013-03-13 ENCOUNTER — Ambulatory Visit (INDEPENDENT_AMBULATORY_CARE_PROVIDER_SITE_OTHER): Payer: 59 | Admitting: Gastroenterology

## 2013-03-13 VITALS — BP 96/70 | HR 60 | Ht 71.0 in | Wt 175.1 lb

## 2013-03-13 DIAGNOSIS — K5150 Left sided colitis without complications: Secondary | ICD-10-CM

## 2013-03-13 DIAGNOSIS — Z23 Encounter for immunization: Secondary | ICD-10-CM

## 2013-03-13 MED ORDER — MESALAMINE 800 MG PO TBEC: DELAYED_RELEASE_TABLET | ORAL | Status: DC

## 2013-03-13 NOTE — Patient Instructions (Signed)
We have sent the following medications to your pharmacy for you to pick up at your convenience: Asacol.  We have given you the flu shot today.  Thank you for choosing me and [facility].  Pricilla Riffle. Dagoberto Ligas., MD., Marval Regal

## 2013-03-13 NOTE — Progress Notes (Signed)
    History of Present Illness: This is a 47 year old male with left-sided ulcerative colitis who returns for followup with complete resolution of his symptoms. He completed a one-month course of Uceris and the symptoms abated. He remains on Asacol 4.8 g per day. He has no GI complaints.  Current Medications, Allergies, Past Medical History, Past Surgical History, Family History and Social History were reviewed in [employer] record.  Physical Exam: General: Well developed , well nourished, no acute distress Head: Normocephalic and atraumatic Eyes:  sclerae anicteric, EOMI Ears: Normal auditory acuity Mouth: No deformity or lesions Lungs: Clear throughout to auscultation Heart: Regular rate and rhythm; no murmurs, rubs or bruits Abdomen: Soft, non tender and non distended. No masses, hepatosplenomegaly or hernias noted. Normal Bowel sounds Musculoskeletal: Symmetrical with no gross deformities  Extremities: No clubbing, cyanosis, edema or deformities noted Neurological: Alert oriented x 4, grossly nonfocal Psychological:  Alert and cooperative. Normal mood and affect  Assessment and Recommendations:  1. Left-sided ulcerative colitis. Symptoms under excellent control. Maintain Asacol 4.8 g per day. I reviewed with him the natural history of ulcerative colitis and the importance of long-term maintenance medications. Return office in 6 months.

## 2013-03-20 ENCOUNTER — Other Ambulatory Visit: Payer: Self-pay | Admitting: Internal Medicine

## 2013-03-20 NOTE — Telephone Encounter (Signed)
Levothyroxine refill sent to pharmacy

## 2013-04-29 ENCOUNTER — Telehealth: Payer: Self-pay

## 2013-04-29 NOTE — Telephone Encounter (Signed)
Medication List and allergies:  Reviewed and updated  90 day supply/mail order: na Local prescriptions: [facility] [place]  Immunizations due: UTD  A/P:   No changes to FH or PSH or personal hx Tdap--05/2007 Flu vaccine--02/2013 [facility]--2014--follow up with Dr Fuller Plan PSA--07/2011--0.54  To Discuss with Provider: Not at this time

## 2013-04-30 ENCOUNTER — Encounter: Payer: Self-pay | Admitting: Internal Medicine

## 2013-04-30 ENCOUNTER — Ambulatory Visit (INDEPENDENT_AMBULATORY_CARE_PROVIDER_SITE_OTHER): Payer: 59 | Admitting: Internal Medicine

## 2013-04-30 VITALS — BP 106/72 | HR 73 | Temp 98.2°F | Ht 71.9 in | Wt 176.0 lb

## 2013-04-30 DIAGNOSIS — Z0000 Encounter for general adult medical examination without abnormal findings: Secondary | ICD-10-CM

## 2013-04-30 DIAGNOSIS — E039 Hypothyroidism, unspecified: Secondary | ICD-10-CM

## 2013-04-30 LAB — HEPATIC FUNCTION PANEL
ALT: 16 U/L (ref 0–53)
Alkaline Phosphatase: 48 U/L (ref 39–117)
Bilirubin, Direct: 0.1 mg/dL (ref 0.0–0.3)
Total Bilirubin: 0.7 mg/dL (ref 0.3–1.2)

## 2013-04-30 LAB — CBC WITH DIFFERENTIAL/PLATELET
Basophils Absolute: 0 10*3/uL (ref 0.0–0.1)
Basophils Relative: 0.3 % (ref 0.0–3.0)
Eosinophils Absolute: 0.1 10*3/uL (ref 0.0–0.7)
Eosinophils Relative: 2 % (ref 0.0–5.0)
Lymphocytes Relative: 25.1 % (ref 12.0–46.0)
MCHC: 33.8 g/dL (ref 30.0–36.0)
MCV: 87.6 fl (ref 78.0–100.0)
Monocytes Absolute: 0.3 10*3/uL (ref 0.1–1.0)
Neutrophils Relative %: 65.3 % (ref 43.0–77.0)
Platelets: 156 10*3/uL (ref 150.0–400.0)
RBC: 4.68 Mil/uL (ref 4.22–5.81)
RDW: 13.1 % (ref 11.5–14.6)

## 2013-04-30 LAB — BASIC METABOLIC PANEL
BUN: 16 mg/dL (ref 6–23)
CO2: 26 mEq/L (ref 19–32)
Calcium: 9.1 mg/dL (ref 8.4–10.5)
Chloride: 103 mEq/L (ref 96–112)
Creatinine, Ser: 1.1 mg/dL (ref 0.4–1.5)
GFR: 77.63 mL/min (ref >60.00)

## 2013-04-30 LAB — LIPID PANEL
Cholesterol: 156 mg/dL (ref 0–200)
HDL: 42.8 mg/dL (ref >39.00)
LDL Cholesterol: 94 mg/dL (ref 0–99)
Total CHOL/HDL Ratio: 4
Triglycerides: 94 mg/dL (ref 0.0–149.0)
VLDL: 18.8 mg/dL (ref 0.0–40.0)

## 2013-04-30 LAB — TSH: TSH: 1.74 u[IU]/mL (ref 0.35–5.50)

## 2013-04-30 NOTE — Progress Notes (Signed)
   Subjective:    Patient ID: Tanner White, male    DOB: 19-Jun-1965, 47 y.o.   MRN: [id number]  HPI He is here for a physical;acute issues denied.     Review of Systems  There has been no change in the dose, brand, or mode of administration of thyroid supplement.  Pertinent negative or absent signs and symptoms are as follows: Constitutional: 6#drop in weight with colitis flare.No significant fatigue; sleep disorder;or change in appetite. Eye: Vision blurred due to prior corneal scarring. NO double or loss of vision Cardiovascular: no palpitations; racing; irregularity ENT/GI: no constipation; diarrhea;hoarseness;dysphagia Derm: no change in nails,hair,skin Neuro: no numbness or tingling; tremor Psych:no anxiety; depression Endo: no temperature intolerance to heat ,cold      Objective:   Physical Exam Gen.: Thin but healthy and well-nourished in appearance. Alert, appropriate and cooperative throughout exam.Appears younger than stated age  Head: Normocephalic without obvious abnormalities  Eyes: No corneal or conjunctival inflammation noted. Pupils equal round reactive to light and accommodation. Extraocular motion intact. No lid lag or proptosis Ears: External  ear exam reveals no significant lesions or deformities. Canals clear .TMs normal. Hearing is grossly normal bilaterally. Nose: External nasal exam reveals no deformity or inflammation. Nasal mucosa are pink and moist. No lesions or exudates noted.  Mouth: Oral mucosa and oropharynx reveal no lesions or exudates. Teeth in good repair. Neck: No deformities, masses, or tenderness noted. Range of motion normal. Thyroid: goiter on L. Lungs: Normal respiratory effort; chest expands symmetrically. Lungs are clear to auscultation without rales, wheezes, or increased work of breathing. Heart: Normal rate and rhythm. Normal S1 and S2. No gallop, click, or rub. S4 w/o murmur. Abdomen: Bowel sounds normal; abdomen soft and nontender.  No masses, organomegaly or hernias noted. Genitalia: Genitalia normal except for left varices. Prostate checked @ colonoscopy 2/14. Musculoskeletal/extremities: No deformity or scoliosis noted of  the thoracic or lumbar spine.  No clubbing, cyanosis, edema, or significant extremity  deformity noted. Range of motion normal .Tone & strength normal. Hand joints normal . Fingernail l health good. Able to lie down & sit up w/o help. Negative SLR bilaterally Vascular: Carotid, radial artery, dorsalis pedis and  posterior tibial pulses are full and equal. No bruits present. Neurologic: Alert and oriented x3. Deep tendon reflexes symmetrical and normal.       Skin: Intact without suspicious lesions or rashes. Lymph: No cervical, axillary, or inguinal lymphadenopathy present. Psych: Mood and affect are normal. Normally interactive                                                                                       Assessment & Plan:  #1 comprehensive physical exam; no acute findings  Plan: see Orders  & Recommendations

## 2013-04-30 NOTE — Patient Instructions (Signed)
Your next office appointment will be determined based upon review of your pending labs &/ or x-rays. Those instructions will be transmitted to you through My Chart  .

## 2013-04-30 NOTE — Progress Notes (Signed)
Pre visit review using our clinic review tool, if applicable. No additional management support is needed unless otherwise documented below in the visit note. 

## 2013-08-19 ENCOUNTER — Other Ambulatory Visit: Payer: Self-pay | Admitting: Internal Medicine

## 2013-08-20 NOTE — Telephone Encounter (Signed)
Rx sent to the pharmacy by e-script.//AB/CMA 

## 2013-09-17 ENCOUNTER — Other Ambulatory Visit: Payer: Self-pay | Admitting: Internal Medicine

## 2013-10-01 ENCOUNTER — Ambulatory Visit (INDEPENDENT_AMBULATORY_CARE_PROVIDER_SITE_OTHER): Payer: 59 | Admitting: Internal Medicine

## 2013-10-01 ENCOUNTER — Encounter: Payer: Self-pay | Admitting: Internal Medicine

## 2013-10-01 VITALS — BP 118/78 | HR 65 | Temp 97.8°F | Resp 13 | Wt 177.4 lb

## 2013-10-01 DIAGNOSIS — J309 Allergic rhinitis, unspecified: Secondary | ICD-10-CM

## 2013-10-01 DIAGNOSIS — H8110 Benign paroxysmal vertigo, unspecified ear: Secondary | ICD-10-CM

## 2013-10-01 NOTE — Patient Instructions (Signed)
Plain Mucinex (NOT D) for thick secretions ;force NON dairy fluids .   Nasal cleansing in the shower as discussed with lather of mild shampoo.After 10 seconds wash off lather while  exhaling through nostrils. Make sure that all residual soap is removed to prevent irritation.  Flonase OR Nasacort AQ 1 spray in each nostril twice a day as needed. Use the "crossover" technique into opposite nostril spraying toward opposite ear @ 45 degree angle, not straight up into nostril.  Use a Neti pot daily only  as needed for significant sinus congestion; going from open side to congested side . Plain Allegra (NOT D )  160 daily , Loratidine 10 mg , OR Zyrtec 10 mg @ bedtime  as needed for itchy eyes & sneezing. Go to Web M.D. for information on benign positional vertigo (BPV) . Physical therapy exercises can treat that if symptoms persist with above.

## 2013-10-01 NOTE — Progress Notes (Signed)
Pre visit review using our clinic review tool, if applicable. No additional management support is needed unless otherwise documented below in the visit note. 

## 2013-10-01 NOTE — Progress Notes (Signed)
Subjective:    Patient ID: Tanner White, male    DOB: [birth year], 48 y.o.   MRN: [id number]  HPI  His symptoms began approximately one month ago without specific trigger or incidents. They have been worse over the last 3 days; but this may be improved this morning.  It is manifested as lightheadedness upon first awakening. This is  definitely related to change in position in the bed. He will have diplopia which last a few minutes. The lightheadedness will persist until 2-3 PM. It is not related to change in position or activity during the day.  There is no cardiac or neurologic prodrome prior to the symptoms  He also has tinnitus which is unrelated and chronic.    Review of Systems  Other  symptom include nonproductive cough & sinus pressure he relates to allergies. He specifically denies chest pain, palpitations, or dyspnea.  He's had no fever, chills, sweats, or change in weight  He denies symptoms or signs of rhinosinusitis such as significant frontal headache, facial pain, nasal purulence        Objective:   Physical Exam Gen.: Healthy and well-nourished in appearance. Alert, appropriate and cooperative throughout exam. Appears younger than stated age  Head: Normocephalic without obvious abnormalities  Eyes: No corneal or conjunctival inflammation noted. Pupils equal round reactive to light and accommodation. Extraocular motion intact. FOV WNL.  Vision grossly normal w/o lenses Ears: External  ear exam reveals no significant lesions or deformities. Canals clear .TMs normal. Hearing is grossly normal bilaterally. Tuning fork exam normal. Nose: External nasal exam reveals no deformity or inflammation. Nasal mucosa are pink and moist. No lesions or exudates noted.   Mouth: Oral mucosa and oropharynx reveal no lesions or exudates. Teeth in good repair. Neck: No deformities, masses, or tenderness noted. Range of motion excellent. Lungs: Normal respiratory effort; chest  expands symmetrically. Lungs are clear to auscultation without rales, wheezes, or increased work of breathing. Heart: Normal rate and rhythm. Normal S1 and S2. No gallop, click, or rub. No murmur.                              Musculoskeletal/extremities: No deformity or scoliosis noted of  the thoracic or lumbar spine.  No clubbing, cyanosis, edema, or significant extremity  deformity noted. Range of motion normal .Tone & strength normal. Hand joints normal  Fingernail health good. Able to lie down & sit up w/o help. Negative SLR bilaterally Vascular: Carotid, radial artery pulses are full and equal. No bruits present. Neurologic: Alert and oriented x3. Deep tendon reflexes symmetrical and normal.  Testing for benign positional vertigo included placing him supine as  head was rotated laterally passively while he stared straight head. This revealed no nystagmus .Gait normal  including heel & toe walking . Rhomberg & finger to nose normal      Skin: Intact without suspicious lesions or rashes. Lymph: No cervical, axillary or lymphadenopathy present. Psych: Mood and affect are normal. 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

## 2013-12-09 ENCOUNTER — Other Ambulatory Visit: Payer: Self-pay | Admitting: Gastroenterology

## 2013-12-18 ENCOUNTER — Other Ambulatory Visit: Payer: Self-pay | Admitting: Gastroenterology

## 2013-12-23 ENCOUNTER — Telehealth: Payer: Self-pay | Admitting: Gastroenterology

## 2013-12-23 MED ORDER — MESALAMINE 800 MG PO TBEC: DELAYED_RELEASE_TABLET | ORAL | Status: DC

## 2013-12-23 NOTE — Telephone Encounter (Signed)
Gave one refill to patient's pharmacy until scheduled appt on 01/14/14.

## 2013-12-31 ENCOUNTER — Other Ambulatory Visit (INDEPENDENT_AMBULATORY_CARE_PROVIDER_SITE_OTHER): Payer: 59

## 2013-12-31 ENCOUNTER — Encounter: Payer: Self-pay | Admitting: Internal Medicine

## 2013-12-31 ENCOUNTER — Ambulatory Visit (INDEPENDENT_AMBULATORY_CARE_PROVIDER_SITE_OTHER): Payer: 59 | Admitting: Internal Medicine

## 2013-12-31 VITALS — BP 112/80 | HR 64 | Temp 98.1°F | Resp 12 | Wt 179.1 lb

## 2013-12-31 DIAGNOSIS — Z8669 Personal history of other diseases of the nervous system and sense organs: Secondary | ICD-10-CM

## 2013-12-31 DIAGNOSIS — R5381 Other malaise: Secondary | ICD-10-CM

## 2013-12-31 DIAGNOSIS — R202 Paresthesia of skin: Secondary | ICD-10-CM

## 2013-12-31 DIAGNOSIS — H9313 Tinnitus, bilateral: Secondary | ICD-10-CM

## 2013-12-31 DIAGNOSIS — E039 Hypothyroidism, unspecified: Secondary | ICD-10-CM

## 2013-12-31 DIAGNOSIS — R209 Unspecified disturbances of skin sensation: Secondary | ICD-10-CM

## 2013-12-31 DIAGNOSIS — R5383 Other fatigue: Principal | ICD-10-CM

## 2013-12-31 DIAGNOSIS — H9319 Tinnitus, unspecified ear: Secondary | ICD-10-CM

## 2013-12-31 LAB — BASIC METABOLIC PANEL
BUN: 16 mg/dL (ref 6–23)
CALCIUM: 9.4 mg/dL (ref 8.4–10.5)
CO2: 28 mEq/L (ref 19–32)
Chloride: 103 mEq/L (ref 96–112)
Creatinine, Ser: 1.1 mg/dL (ref 0.4–1.5)
GFR: 79.07 mL/min (ref >60.00)
GLUCOSE: 87 mg/dL (ref 70–99)
Potassium: 3.9 mEq/L (ref 3.5–5.1)
Sodium: 139 mEq/L (ref 135–145)

## 2013-12-31 LAB — CBC WITH DIFFERENTIAL/PLATELET
BASOS PCT: 0.4 % (ref 0.0–3.0)
Basophils Absolute: 0 10*3/uL (ref 0.0–0.1)
Eosinophils Absolute: 0.1 10*3/uL (ref 0.0–0.7)
Eosinophils Relative: 1.4 % (ref 0.0–5.0)
HCT: 44.2 % (ref 39.0–52.0)
HEMOGLOBIN: 14.9 g/dL (ref 13.0–17.0)
LYMPHS PCT: 17.4 % (ref 12.0–46.0)
Lymphs Abs: 1 10*3/uL (ref 0.7–4.0)
MCHC: 33.6 g/dL (ref 30.0–36.0)
MCV: 89 fl (ref 78.0–100.0)
MONOS PCT: 7 % (ref 3.0–12.0)
Monocytes Absolute: 0.4 10*3/uL (ref 0.1–1.0)
NEUTROS ABS: 4.2 10*3/uL (ref 1.4–7.7)
Neutrophils Relative %: 73.8 % (ref 43.0–77.0)
Platelets: 188 10*3/uL (ref 150.0–400.0)
RBC: 4.96 Mil/uL (ref 4.22–5.81)
RDW: 12.9 % (ref 11.5–15.5)
WBC: 5.7 10*3/uL (ref 4.0–10.5)

## 2013-12-31 LAB — HEMOGLOBIN A1C: Hgb A1c MFr Bld: 4.9 % (ref 4.6–6.5)

## 2013-12-31 LAB — HEPATIC FUNCTION PANEL
ALT: 17 U/L (ref 0–53)
AST: 17 U/L (ref 0–37)
Albumin: 4.1 g/dL (ref 3.5–5.2)
Alkaline Phosphatase: 47 U/L (ref 39–117)
Bilirubin, Direct: 0.1 mg/dL (ref 0.0–0.3)
TOTAL PROTEIN: 7.3 g/dL (ref 6.0–8.3)
Total Bilirubin: 0.9 mg/dL (ref 0.2–1.2)

## 2013-12-31 LAB — TSH: TSH: 4.51 u[IU]/mL — ABNORMAL HIGH (ref 0.35–4.50)

## 2013-12-31 LAB — VITAMIN B12: Vitamin B-12: 361 pg/mL (ref 211–911)

## 2013-12-31 NOTE — Patient Instructions (Signed)
Your next office appointment will be determined based upon review of your pending labs . Those instructions will be transmitted to you through My Chart   Followup as needed for your acute issue. Please report any significant change in your symptoms. Share results with Dr Lucita Ferrara.

## 2013-12-31 NOTE — Progress Notes (Signed)
Pre visit review using our clinic review tool, if applicable. No additional management support is needed unless otherwise documented below in the visit note. 

## 2013-12-31 NOTE — Progress Notes (Signed)
Subjective:    Patient ID: Tanner White, male    DOB: Jan 14, 1966, 48 y.o.   MRN: [id number]  HPI   He has had malaise for least a month. He also has paresthesias in his fingertips and tips of his toes described as a fullness and tingling. This occurs during the day, not upon awakening. He also has symptoms intermittently in his lips. He is not on ACE inhibitor or ARB.  He has intermittent double vision mainly in the mornings and afternoons. He also has intermittent blurred vision. He has an appointment to see Dr. Lucita Ferrara, his Ophthalmologist. Symptoms do  resolve if he closes eyes and" concentrates". He has a diagnosis of "vascular intrusion".  He also describes intermittent benign positional vertigo.  He has had increased tinnitus recently.   Review of Systems  He specifically denies fever, chills, sweats, or weight loss.  He has no itchy, watery eyes or sneezing. There is also associated angioedema noted.  He has no hoarseness or trouble swallowing  He denies dyspnea, cough, sputum production  There is no associated chest pain, palpitations, or paroxysmal nocturnal dyspnea  Constipation, diarrhea, melena, rectal bleeding are absent.  There is no associated redness or swelling of joints. He has no myalgias.  No recent change in hair, skin, nails  There is no history of excessive snoring or apnea.  No abnormal bruising or bleeding.  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 One hour after breakfast and lunch sugars range 70+-90. There is no family history of diabetes.     Objective:   Physical Exam Positive or pertinent findings include: Minor pattern alopecia. The thyroid is asymmetric without enlargement. Left lobe is larger than the right. Full neurologic exam was negative to include cranial nerves. Sensation of the tips of the fingers and toes normal. Whisper test at 6 ft & tuning fork exams are normal.  Gen.: Healthy and well-nourished  in appearance. Alert, appropriate and cooperative throughout exam. Appears younger than stated age  Head: Normocephalic without obvious abnormalities Eyes: No corneal or conjunctival inflammation noted. Pupils equal round reactive to light and accommodation. Extraocular motion intact. Field of  Vision grossly normal  Ears: External  ear exam reveals no significant lesions or deformities. Canals clear .TMs normal. Nose: External nasal exam reveals no deformity or inflammation. Nasal mucosa are pink and moist. No lesions or exudates noted.   Mouth: Oral mucosa and oropharynx reveal no lesions or exudates. Teeth in good repair. Neck: No deformities, masses, or tenderness noted. Range of motion normal. Lungs: Normal respiratory effort; chest expands symmetrically. Lungs are clear to auscultation without rales, wheezes, or increased work of breathing. Heart: Normal rate and rhythm. Normal S1 and S2. No gallop, click, or rub. No murmur. Abdomen: Bowel sounds normal; abdomen soft and nontender. No masses, organomegaly or hernias noted.                             Musculoskeletal/extremities: No deformity or scoliosis noted of  the thoracic or lumbar spine.  Hand joints normal Fingernail / toenail health good.  Able to lie down & sit up w/o help. Negative SLR bilaterally Vascular: Carotid, radial artery, dorsalis pedis and  posterior tibial pulses are full and equal. No bruits present. Neurologic: Alert and oriented x3. Deep tendon reflexes symmetrical and normal.  Gait normal  including heel & toe walking . Rhomberg & finger to nose normal.  Skin: Intact without suspicious lesions or rashes. Lymph: No cervical, axillary lymphadenopathy present. Psych: Mood and affect are normal. Normally interactive                                                                                        Assessment & Plan:  #1 malaise  #2 diplopia  #3 paresthesias of fingers and toes  #4 tinnitus,  increasing  Plan: See orders and recommendations

## 2014-01-01 ENCOUNTER — Other Ambulatory Visit: Payer: Self-pay | Admitting: Internal Medicine

## 2014-01-01 DIAGNOSIS — E039 Hypothyroidism, unspecified: Secondary | ICD-10-CM

## 2014-01-09 ENCOUNTER — Other Ambulatory Visit: Payer: Self-pay

## 2014-01-09 DIAGNOSIS — E039 Hypothyroidism, unspecified: Secondary | ICD-10-CM

## 2014-01-09 MED ORDER — LEVOTHYROXINE SODIUM 100 MCG PO TABS: ORAL_TABLET | ORAL | Status: DC

## 2014-01-14 ENCOUNTER — Encounter: Payer: Self-pay | Admitting: Gastroenterology

## 2014-01-14 ENCOUNTER — Ambulatory Visit (INDEPENDENT_AMBULATORY_CARE_PROVIDER_SITE_OTHER): Payer: 59 | Admitting: Gastroenterology

## 2014-01-14 VITALS — BP 110/60 | HR 66 | Ht 71.9 in | Wt 179.8 lb

## 2014-01-14 DIAGNOSIS — K5150 Left sided colitis without complications: Secondary | ICD-10-CM

## 2014-01-14 MED ORDER — MESALAMINE 800 MG PO TBEC: DELAYED_RELEASE_TABLET | ORAL | Status: DC

## 2014-01-14 NOTE — Progress Notes (Signed)
    History of Present Illness: This is a 48 year old male returning for followup of ulcerative colitis. He relates occasional mild fatigue and occasional episodes of loose stools. He has not complied with recommended followup at 6 months and has not taken Asacol HD as prescribed. Recent blood work was unremarkable.   Current Medications, Allergies, Past Medical History, Past Surgical History, Family History and Social History were reviewed in [employer] record.  Physical Exam: General: Well developed , well nourished, no acute distress Head: Normocephalic and atraumatic Eyes:  sclerae anicteric, EOMI Ears: Normal auditory acuity Mouth: No deformity or lesions Lungs: Clear throughout to auscultation Heart: Regular rate and rhythm; no murmurs, rubs or bruits Abdomen: Soft, non tender and non distended. No masses, hepatosplenomegaly or hernias noted. Normal Bowel sounds Musculoskeletal: Symmetrical with no gross deformities  Pulses:  Normal pulses noted Extremities: No clubbing, cyanosis, edema or deformities noted Neurological: Alert oriented x 4, grossly nonfocal Psychological:  Alert and cooperative. Normal mood and affect  Assessment and Recommendations:  1. Left-sided ulcerative colitis. Symptoms under excellent control. Non compliant with recommended follow up. Maintain Asacol 4.8 g per day. I reviewed with him the natural history of ulcerative colitis and the importance of long-term maintenance medications. Refill Asacol HD for 6 months. Return office in 6 months.

## 2014-01-14 NOTE — Patient Instructions (Addendum)
We have sent the following prescriptions to your mail in pharmacy: Asacol.    If you have not heard from your mail in pharmacy within 1 week or if you have not received your medication in the mail, please contact us at [phone] so we may find out why.   Thank you for choosing me and [facility].  Pricilla Riffle. Dagoberto Ligas., MD., [facility] Regal

## 2014-02-12 ENCOUNTER — Other Ambulatory Visit: Payer: Self-pay | Admitting: Gastroenterology

## 2014-05-28 ENCOUNTER — Other Ambulatory Visit: Payer: Self-pay | Admitting: Internal Medicine

## 2014-06-03 IMAGING — US US THYROID BIOPSY
1 series · 7 of 7 positions shown · non-contrast
Comparison: none

INDICATION: Indeterminate dominant left-sided thyroid nodule

[Series 1: us thyroid biopsy · 0.05mm/px · 7 acquisitions, 7 frames shown]
[im 1/7]
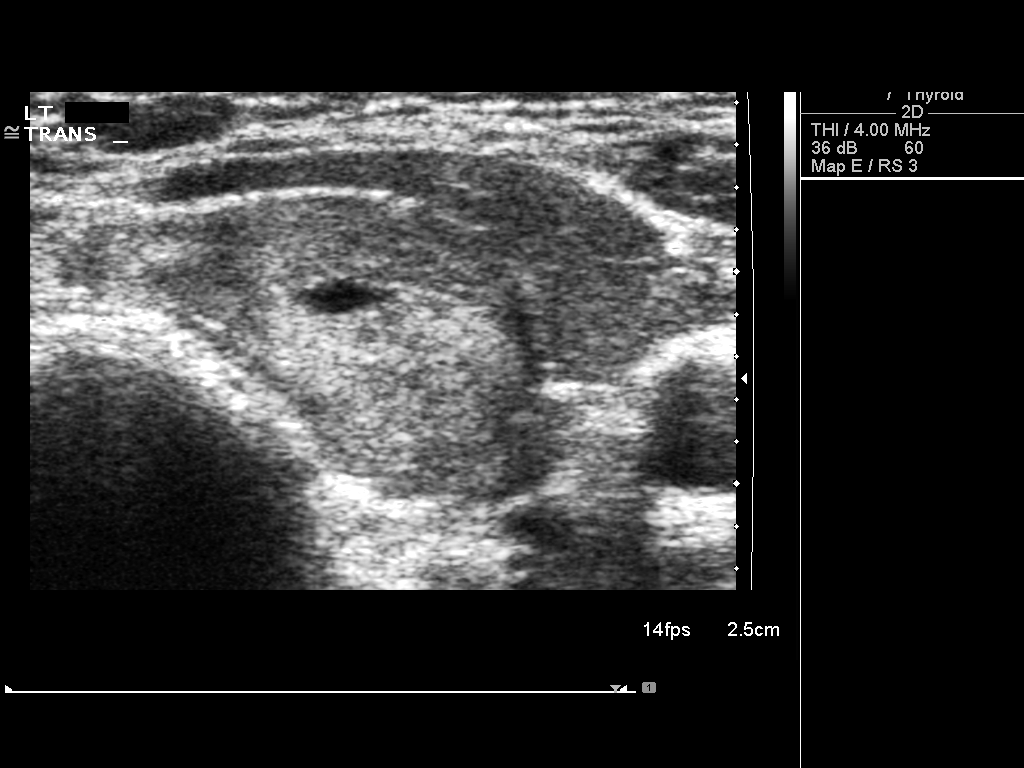
[im 2/7]
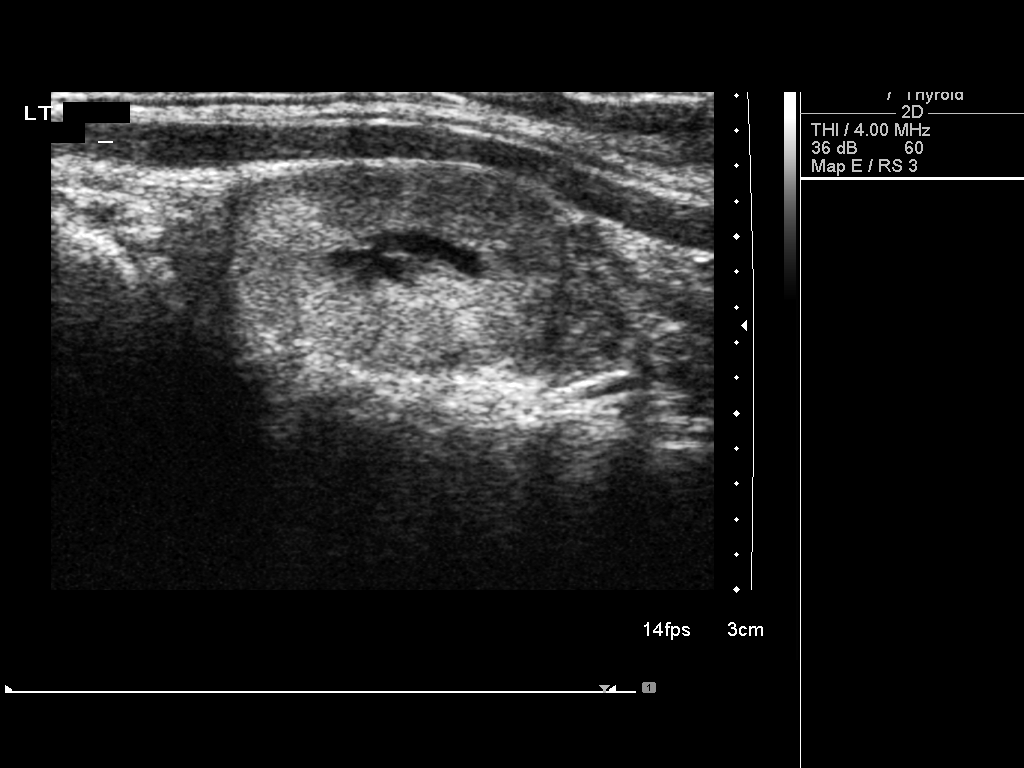
[im 3/7]
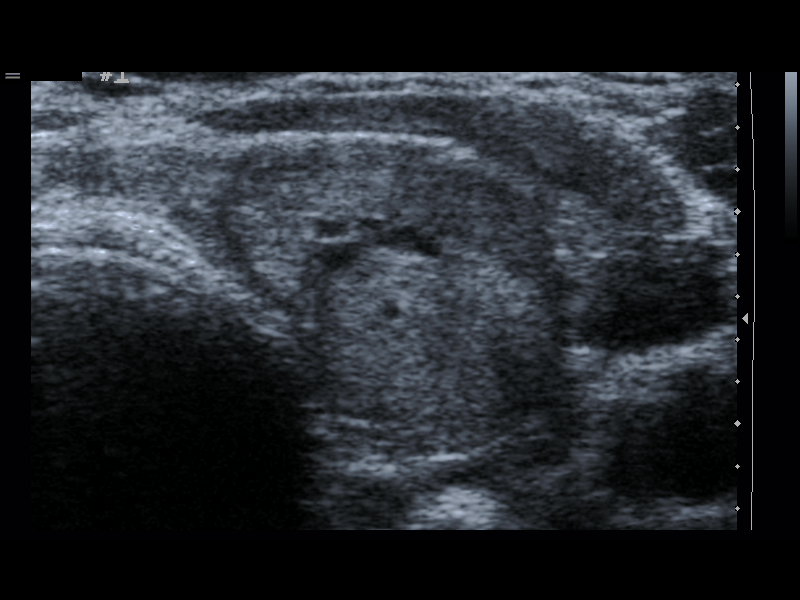
[im 4/7]
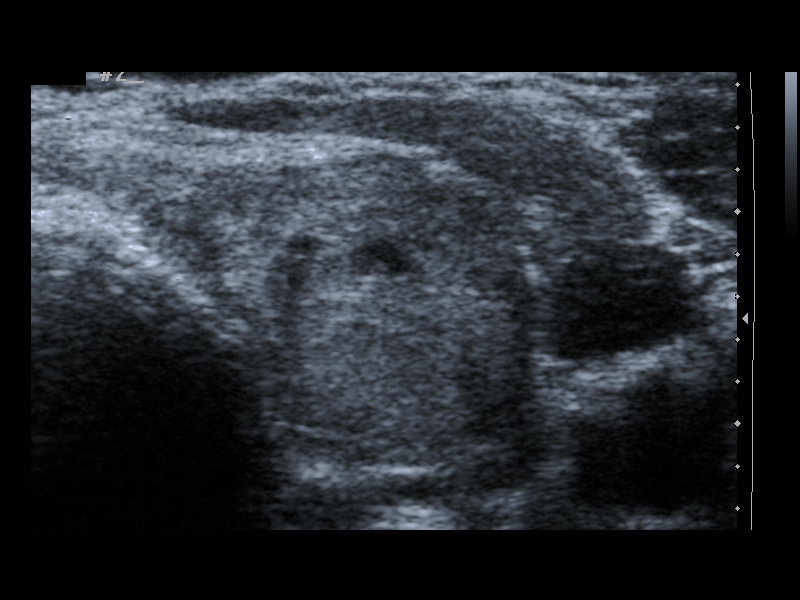
[im 5/7]
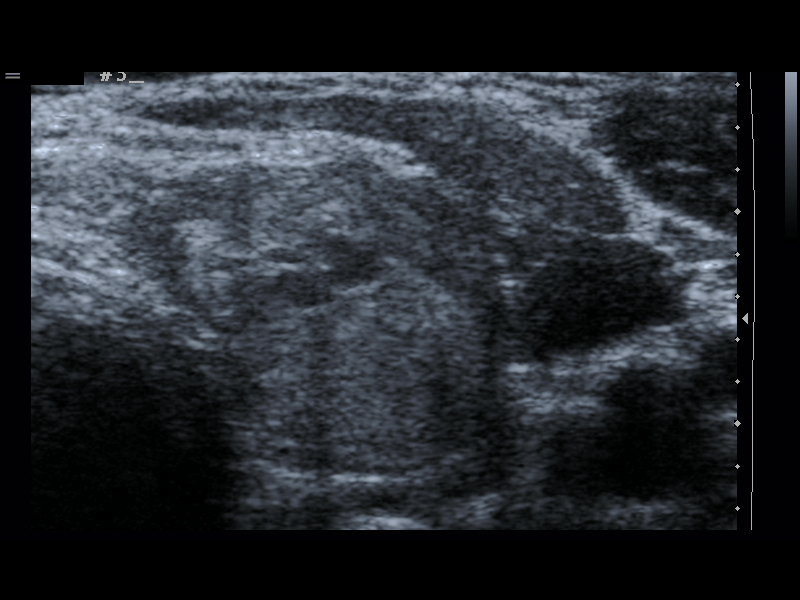
[im 6/7]
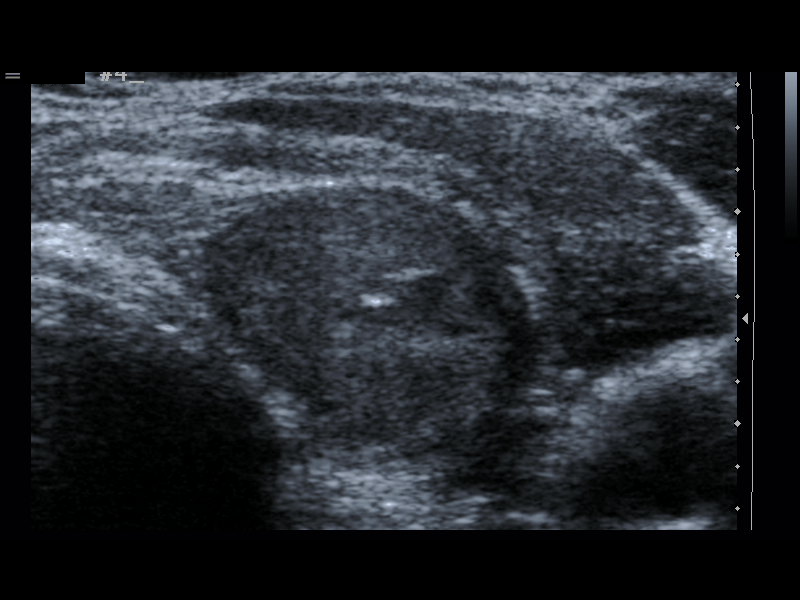
[im 7/7]
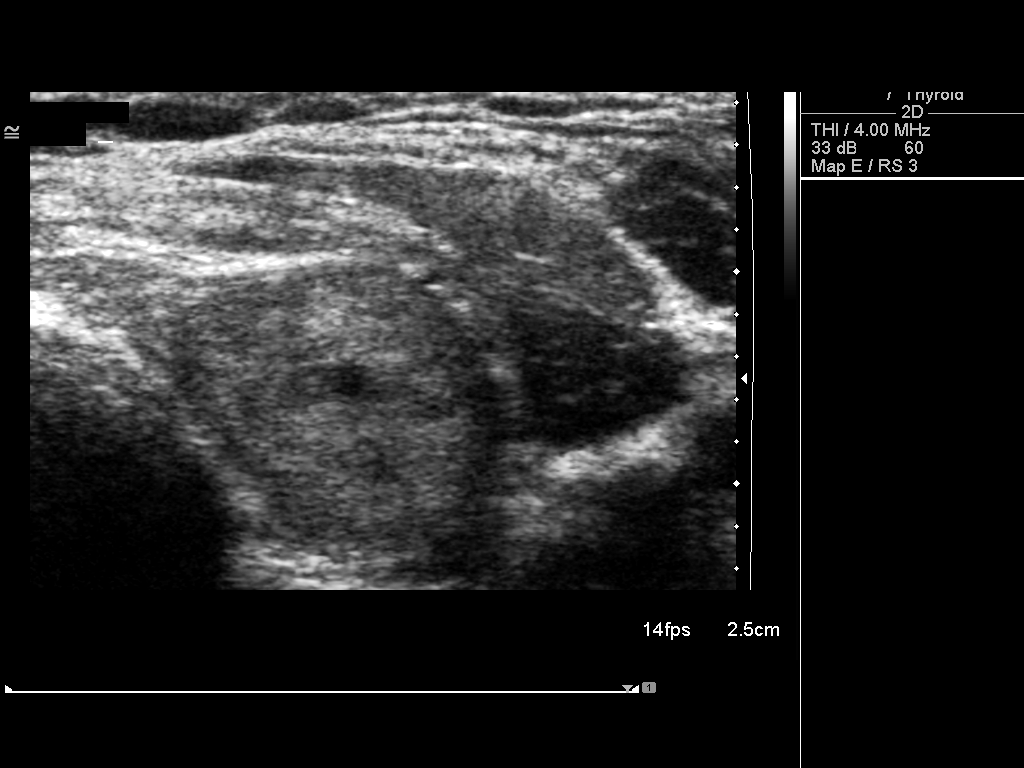

[7 of 7 positions shown; findings below may reference images not displayed]

ULTRASOUND GUIDED THYROID FINE NEEDLE ASPIRATION

Comparisons: Thyroid Ultrasound - 10/29/2012

Intravenous Medications: None

Complications: None immediate

Technique / Findings:

Informed written consent was obtained from the patient after a
discussion of the risks, benefits and alternatives to treatment.
Questions regarding the procedure were encouraged and answered.  A
timeout was performed prior to the initiation of the procedure.

Pre-procedural ultrasound scanning demonstrated grossly unchanged
appearance of dominant solid nodule within the medial inferior
aspect of the left lobe of the thyroid.  The procedure was planned.
The neck was prepped in the usual sterile fashion, and a sterile
drape was applied covering the operative field.  A timeout was
performed prior to the initiation of the procedure.  Local
anesthesia was provided with 1% lidocaine.

Under direct ultrasound guidance, 4 FNA biopsies were performed of
nodule with a 25 gauge needle.  The samples were prepared and
submitted to pathology.  Limited post procedural scanning was
negative for hematoma or additional complication.  A dressing was
placed.  The patient tolerated procedure well without immediate
postprocedural complication.
IMPRESSION: Technically successful ultrasound guided fine needle aspiration of
dominate indeterminate nodule within the medial inferior aspect of
the left lobe of the thyroid

## 2014-08-11 ENCOUNTER — Other Ambulatory Visit: Payer: Self-pay | Admitting: Internal Medicine

## 2014-10-03 ENCOUNTER — Encounter: Payer: Self-pay | Admitting: Gastroenterology

## 2014-10-15 ENCOUNTER — Encounter: Payer: Self-pay | Admitting: Internal Medicine

## 2014-10-15 ENCOUNTER — Ambulatory Visit (INDEPENDENT_AMBULATORY_CARE_PROVIDER_SITE_OTHER): Payer: PRIVATE HEALTH INSURANCE | Admitting: Internal Medicine

## 2014-10-15 ENCOUNTER — Other Ambulatory Visit (INDEPENDENT_AMBULATORY_CARE_PROVIDER_SITE_OTHER): Payer: PRIVATE HEALTH INSURANCE

## 2014-10-15 VITALS — BP 108/76 | HR 55 | Temp 97.9°F | Resp 12 | Wt 184.1 lb

## 2014-10-15 DIAGNOSIS — E039 Hypothyroidism, unspecified: Secondary | ICD-10-CM

## 2014-10-15 DIAGNOSIS — Z0189 Encounter for other specified special examinations: Secondary | ICD-10-CM

## 2014-10-15 DIAGNOSIS — E042 Nontoxic multinodular goiter: Secondary | ICD-10-CM | POA: Diagnosis not present

## 2014-10-15 DIAGNOSIS — Z0000 Encounter for general adult medical examination without abnormal findings: Secondary | ICD-10-CM

## 2014-10-15 DIAGNOSIS — E049 Nontoxic goiter, unspecified: Secondary | ICD-10-CM

## 2014-10-15 LAB — LIPID PANEL
Cholesterol: 152 mg/dL (ref 0–200)
HDL: 43.7 mg/dL (ref >39.00)
LDL Cholesterol: 89 mg/dL (ref 0–99)
NONHDL: 108.3
TRIGLYCERIDES: 99 mg/dL (ref 0.0–149.0)
Total CHOL/HDL Ratio: 3
VLDL: 19.8 mg/dL (ref 0.0–40.0)

## 2014-10-15 LAB — BASIC METABOLIC PANEL
BUN: 14 mg/dL (ref 6–23)
CALCIUM: 9.6 mg/dL (ref 8.4–10.5)
CHLORIDE: 102 meq/L (ref 96–112)
CO2: 31 meq/L (ref 19–32)
Creatinine, Ser: 1.08 mg/dL (ref 0.40–1.50)
GFR: 77.96 mL/min (ref >60.00)
Glucose, Bld: 93 mg/dL (ref 70–99)
POTASSIUM: 3.9 meq/L (ref 3.5–5.1)
SODIUM: 137 meq/L (ref 135–145)

## 2014-10-15 LAB — TSH: TSH: 2.15 u[IU]/mL (ref 0.35–4.50)

## 2014-10-15 LAB — HEPATIC FUNCTION PANEL
ALT: 15 U/L (ref 0–53)
AST: 17 U/L (ref 0–37)
Albumin: 4.5 g/dL (ref 3.5–5.2)
Alkaline Phosphatase: 46 U/L (ref 39–117)
Bilirubin, Direct: 0.1 mg/dL (ref 0.0–0.3)
Total Bilirubin: 0.8 mg/dL (ref 0.2–1.2)
Total Protein: 7.3 g/dL (ref 6.0–8.3)

## 2014-10-15 LAB — CBC WITH DIFFERENTIAL/PLATELET
BASOS PCT: 0.3 % (ref 0.0–3.0)
Basophils Absolute: 0 10*3/uL (ref 0.0–0.1)
Eosinophils Absolute: 0.1 10*3/uL (ref 0.0–0.7)
Eosinophils Relative: 2.1 % (ref 0.0–5.0)
HEMATOCRIT: 43.8 % (ref 39.0–52.0)
HEMOGLOBIN: 14.8 g/dL (ref 13.0–17.0)
Lymphocytes Relative: 24.7 % (ref 12.0–46.0)
Lymphs Abs: 1 10*3/uL (ref 0.7–4.0)
MCHC: 33.7 g/dL (ref 30.0–36.0)
MCV: 88.1 fl (ref 78.0–100.0)
MONO ABS: 0.3 10*3/uL (ref 0.1–1.0)
MONOS PCT: 6.6 % (ref 3.0–12.0)
Neutro Abs: 2.8 10*3/uL (ref 1.4–7.7)
Neutrophils Relative %: 66.3 % (ref 43.0–77.0)
PLATELETS: 166 10*3/uL (ref 150.0–400.0)
RBC: 4.97 Mil/uL (ref 4.22–5.81)
RDW: 13.7 % (ref 11.5–15.5)
WBC: 4.2 10*3/uL (ref 4.0–10.5)

## 2014-10-15 NOTE — Progress Notes (Signed)
Pre visit review using our clinic review tool, if applicable. No additional management support is needed unless otherwise documented below in the visit note. 

## 2014-10-15 NOTE — Progress Notes (Signed)
Subjective:    Patient ID: Tanner White, male    DOB: [birth year], 49 y.o.   MRN: [id number]  HPI He is here for a physical;acute issues denied.  He is on no specific diet unless he has a flare of colitis. At that time he will decrease grease, dairy &  bread. The flares are associated with loose stool and nausea. He has never smoked. He drinks 2 beers per week. He exercises twice a week on a bike without cardiopulmonary symptoms.  Dr. Fuller Plan monitors his colitis; his last colonoscopy was 2012.  He had a fine-needle aspiration of his thyroid nodule 2 years ago; pathology was negative. He has been compliant with his thyroid medication without adverse effect.  There is a family history of colon cancer in his maternal grandfather and paternal grandmother. There is no family history heart attack, stroke,or diabetes .  He has rosacea and sees Dr. [place] Crane.      Review of Systems  Chest pain, palpitations, tachycardia, exertional dyspnea, paroxysmal nocturnal dyspnea, claudication or edema are absent. No unexplained weight loss, abdominal pain, significant dyspepsia, dysphagia, melena, rectal bleeding, or persistently small caliber stools. Dysuria, pyuria, hematuria, frequency, nocturia or polyuria are denied. Change in hair, skin, nails denied. No bowel changes of constipation or diarrhea. No intolerance to heat or cold.     Objective:   Physical Exam Gen.: Adequately nourished in appearance. Alert, appropriate and cooperative throughout exam. BMI:25.04 Appears younger than stated age  Head: Normocephalic without obvious abnormalities; pattern alopecia  Eyes: No corneal or conjunctival inflammation noted. Pupils equal round reactive to light and accommodation. Extraocular motion intact.  Ears: External  ear exam reveals no significant lesions or deformities. Canals clear .TMs normal. Hearing is grossly normal bilaterally. Nose: External nasal exam reveals no deformity or inflammation.  Nasal mucosa are pink and moist. No lesions or exudates noted.   Mouth: Oral mucosa and oropharynx reveal no lesions or exudates. Teeth in good repair. Neck: No deformities, masses, or tenderness noted. Range of motion normal. Thyroid :firm L lobe suggesting goiter;no nodularity. Lungs: Normal respiratory effort; chest expands symmetrically. Lungs are clear to auscultation without rales, wheezes, or increased work of breathing. Heart: Normal rate and rhythm. Normal S1 and S2. No gallop, click, or rub. No murmur. Abdomen: Bowel sounds normal; abdomen soft and nontender. No masses, organomegaly or hernias noted. Genitalia: Genitalia normal except for left varices. Prostate is normal without enlargement, asymmetry, nodularity, or induration                                 Musculoskeletal/extremities: No deformity or scoliosis noted of  the thoracic or lumbar spine.  No clubbing, cyanosis, edema, or significant extremity  deformity noted.  Range of motion normal . Tone & strength normal. Hand joints normal.  Fingernail  health good.No onycholysis. Crepitus of knees,L>R. Able to lie down & sit up w/o help.  Negative SLR bilaterally Vascular: Carotid, radial artery, dorsalis pedis and  posterior tibial pulses are full and equal. No bruits present. Neurologic: Alert and oriented x3. Deep tendon reflexes symmetrical and normal.  Gait normal   Skin: Intact without suspicious lesions or rashes. Lymph: No cervical, axillary, or inguinal lymphadenopathy present. Psych: Mood and affect are normal. Normally interactive  Assessment & Plan:  #1 comprehensive physical exam; no acute findings  Plan: see Orders  & Recommendations

## 2014-10-15 NOTE — Patient Instructions (Signed)
  Your next office appointment will be determined based upon review of your pending labs  and  xrays  Those written interpretation of the lab results and instructions will be transmitted to you by My Chart   Critical results will be called.   Followup as needed for any active or acute issue. Please report any significant change in your symptoms.

## 2014-12-03 ENCOUNTER — Other Ambulatory Visit: Payer: Self-pay | Admitting: Gastroenterology

## 2014-12-08 ENCOUNTER — Other Ambulatory Visit: Payer: Self-pay | Admitting: Gastroenterology

## 2015-01-06 ENCOUNTER — Other Ambulatory Visit: Payer: Self-pay | Admitting: Gastroenterology

## 2015-01-12 ENCOUNTER — Telehealth: Payer: Self-pay | Admitting: Internal Medicine

## 2015-01-12 ENCOUNTER — Encounter: Payer: Self-pay | Admitting: Internal Medicine

## 2015-01-12 NOTE — Telephone Encounter (Signed)
Pt would like to transfer to dr hunter due to dr hopper is retiring. Can I sch?

## 2015-01-12 NOTE — Telephone Encounter (Signed)
Some orders were put in for [facility] for his thyroid and the orders have expired. Can you please put them through again

## 2015-01-12 NOTE — Telephone Encounter (Signed)
Excellent choice

## 2015-01-12 NOTE — Telephone Encounter (Signed)
See below

## 2015-01-12 NOTE — Telephone Encounter (Signed)
Fine with me

## 2015-01-13 NOTE — Telephone Encounter (Signed)
Please advise 

## 2015-01-14 ENCOUNTER — Other Ambulatory Visit: Payer: PRIVATE HEALTH INSURANCE

## 2015-01-14 ENCOUNTER — Ambulatory Visit: Payer: PRIVATE HEALTH INSURANCE | Admitting: Internal Medicine

## 2015-01-14 ENCOUNTER — Encounter: Payer: Self-pay | Admitting: Internal Medicine

## 2015-01-14 ENCOUNTER — Ambulatory Visit (INDEPENDENT_AMBULATORY_CARE_PROVIDER_SITE_OTHER): Payer: PRIVATE HEALTH INSURANCE | Admitting: Internal Medicine

## 2015-01-14 VITALS — BP 118/74 | HR 74 | Temp 98.1°F | Resp 14 | Ht 71.9 in | Wt 181.0 lb

## 2015-01-14 DIAGNOSIS — G479 Sleep disorder, unspecified: Secondary | ICD-10-CM | POA: Diagnosis not present

## 2015-01-14 DIAGNOSIS — H509 Unspecified strabismus: Secondary | ICD-10-CM | POA: Diagnosis not present

## 2015-01-14 DIAGNOSIS — E042 Nontoxic multinodular goiter: Secondary | ICD-10-CM | POA: Diagnosis not present

## 2015-01-14 DIAGNOSIS — E039 Hypothyroidism, unspecified: Secondary | ICD-10-CM

## 2015-01-14 DIAGNOSIS — G7000 Myasthenia gravis without (acute) exacerbation: Secondary | ICD-10-CM | POA: Insufficient documentation

## 2015-01-14 DIAGNOSIS — E049 Nontoxic goiter, unspecified: Secondary | ICD-10-CM

## 2015-01-14 MED ORDER — CLONAZEPAM 0.5 MG PO TABS: ORAL_TABLET | ORAL | Status: DC

## 2015-01-14 MED ORDER — LEVOTHYROXINE SODIUM 100 MCG PO TABS: ORAL_TABLET | ORAL | Status: DC

## 2015-01-14 NOTE — Telephone Encounter (Signed)
Pt has been sch

## 2015-01-14 NOTE — Progress Notes (Signed)
   Subjective:    Patient ID: Tanner White, male    DOB: 27-Nov-1965, 49 y.o.   MRN: [id number]  HPI He complains of fatigue and feeling more stressed. He's waking up throughout the night. He's had some depression and sadness related to recent ophthalmologic issues.  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 The etiology was unclear. Possible herpetic or MRSA involvement was suggested. He was treated with vancomycin at that time. He saw Dr. Lucita Ferrara and Dr. Gershon Crane who have  monitored . A hard contact was recommended because of the corneal changes. During that evaluation for contact; he related a history of diplopia which began 18 months ago. Strabismus of the left eye was diagnosed & he was seen by Dr. Annamaria Boots a specialist.  Dr. Annamaria Boots has ordered acetylcholine receptor antibody screen to rule out myasthenia. He describes some tingling in his upper extremities as well as lower extremities intermittently. He states that he had gone online to research possible myasthenia gravis and "may be looking for symptoms".  He also has a history of colitis for which he is seeing Dr. Fuller Plan.  He is on thyroid replacement for hypothyroidism. His mother and sister have hypothyroidism.  Paternal uncle had rheumatoid arthritis. There is no other autoimmune disease in the family.  Review of Systems   With the stress he's had some decreased appetite with some weight loss. He denies fever, chills, or sweats. There are no active GI symptoms at this time.  He denies any swallowing difficulty or generalized weakness.     Objective:   Physical Exam  Gen.:  Adequately nourished; in no acute distress Eyes: Extraocular motion intact; no lid lag , proptosis , or nystagmus.PERRL Neck: full ROM; no masses ; thyroid asymmetric. Smooth L lobe ;small R lobe Heart: Normal rhythm and rate without significant murmur, gallop, or extra heart sounds Lungs: Chest clear to  auscultation without rales,rales, wheezes Neuro:Deep tendon reflexes are equal and within normal limits; no tremor. Strength & tone normal. Skin/Nails: Warm and dry without significant lesions or rashes; no onycholysis Lymphatic: no cervical or axillary LA Psych: Normally communicative and interactive; no abnormal mood or affect clinically.         Assessment & Plan:  #1 Strabismus; R/O Myasthenia Gravis #2 Hypothyroidism; R/O autoimmune etiology #3 Sleep Disorder due to stress  See orders

## 2015-01-14 NOTE — Progress Notes (Signed)
Pre visit review using our clinic review tool, if applicable. No additional management support is needed unless otherwise documented below in the visit note. 

## 2015-01-14 NOTE — Patient Instructions (Signed)
To prevent sleep dysfunction follow these instructions for sleep hygiene. Do not read, watch TV, or eat in bed. Do not get into bed until you are ready to turn off the light &  to go to sleep. Do not ingest stimulants ( decongestants, diet pills, nicotine, caffeine) after the evening meal.Do not take daytime naps.Cardiovascular exercise, this can be as simple a program as walking, is recommended 30-45 minutes 3-4 times per week. If you're not exercising you should take 6-8 weeks to build up to this level.  Your next office appointment will be determined based upon review of your pending labs and scan  Those written interpretation of the lab results and instructions will be transmitted to you by My Chart  Critical results will be called.   Followup as needed for any active or acute issue. Please report any significant change in your symptoms.

## 2015-01-15 LAB — THYROGLOBULIN ANTIBODY: Thyroglobulin Ab: <1 IU/mL (ref <2)

## 2015-01-15 NOTE — Telephone Encounter (Signed)
Spoke with pt to inform orders had been placed.

## 2015-01-16 ENCOUNTER — Telehealth: Payer: Self-pay | Admitting: Internal Medicine

## 2015-01-16 NOTE — Telephone Encounter (Signed)
Pt faxed some lab work he had done at another facility yesterday and he wants to know if you received it. Please advise patient

## 2015-01-16 NOTE — Telephone Encounter (Signed)
Spoke with pt to inform we received lab work and they will be given to Dr Linna Darner for review

## 2015-01-20 ENCOUNTER — Other Ambulatory Visit: Payer: Self-pay | Admitting: Emergency Medicine

## 2015-01-21 ENCOUNTER — Encounter: Payer: Self-pay | Admitting: Gastroenterology

## 2015-01-21 ENCOUNTER — Ambulatory Visit (INDEPENDENT_AMBULATORY_CARE_PROVIDER_SITE_OTHER): Payer: PRIVATE HEALTH INSURANCE | Admitting: Gastroenterology

## 2015-01-21 ENCOUNTER — Encounter: Payer: Self-pay | Admitting: Internal Medicine

## 2015-01-21 VITALS — BP 100/70 | HR 68 | Ht 71.0 in | Wt 180.4 lb

## 2015-01-21 DIAGNOSIS — K5150 Left sided colitis without complications: Secondary | ICD-10-CM

## 2015-01-21 MED ORDER — MESALAMINE 800 MG PO TBEC: 3.0000 | DELAYED_RELEASE_TABLET | Freq: Two times a day (BID) | ORAL | Status: DC

## 2015-01-21 NOTE — Patient Instructions (Signed)
We have sent the following medications to your pharmacy for you to pick up at your convenience:Asacol.  Thank you for choosing me and [facility].  Pricilla Riffle. Dagoberto Ligas., MD., [facility]al

## 2015-01-21 NOTE — Assessment & Plan Note (Addendum)
Symptoms controlled on Asacol 4.8 g daily. Refill Asacol HD. 3 year interval surveillance colonoscopy due in February 2017. He had several questions about potential extraintestinal manifestations of ulcerative colitis. He is currently undergoing evaluation for strabismus. Arthritis, uveitis, episcleritis, skin disorders, PSC are well known extraintestinal manifestations of ulcerative colitis however he does not demonstrate any of these. 15 minutes of face-to-face time spent with the patient. Greater than 50% time was spent counseling and coordinating care.

## 2015-01-21 NOTE — Progress Notes (Signed)
    History of Present Illness: This is a 49 year old male with left-sided ulcerative colitis. He states he occasionally has several days well healing notes a looseness in his stool but no frank diarrhea. The symptoms usually come under control within a few days on a bland low-fat diet. Blood work from June was reviewed and it was normal.  Current Medications, Allergies, Past Medical History, Past Surgical History, Family History and Social History were reviewed in [employer] record.  Physical Exam: General: Well developed , well nourished, no acute distress Head: Normocephalic and atraumatic Eyes:  sclerae anicteric, EOMI Ears: Normal auditory acuity Mouth: No deformity or lesions Lungs: Clear throughout to auscultation Heart: Regular rate and rhythm; no murmurs, rubs or bruits Abdomen: Soft, non tender and non distended. No masses, hepatosplenomegaly or hernias noted. Normal Bowel sounds Musculoskeletal: Symmetrical with no gross deformities  Pulses:  Normal pulses noted Extremities: No clubbing, cyanosis, edema or deformities noted Neurological: Alert oriented x 4, grossly nonfocal Psychological:  Alert and cooperative. Normal mood and affect  Assessment and Recommendations:

## 2015-01-22 ENCOUNTER — Ambulatory Visit
Admission: RE | Admit: 2015-01-22 | Discharge: 2015-01-22 | Disposition: A | Discharge status: Home / Self Care | Payer: PRIVATE HEALTH INSURANCE | Source: Ambulatory Visit | Attending: Internal Medicine | Admitting: Internal Medicine

## 2015-01-22 DIAGNOSIS — E049 Nontoxic goiter, unspecified: Secondary | ICD-10-CM

## 2015-01-23 ENCOUNTER — Other Ambulatory Visit: Payer: Self-pay | Admitting: Internal Medicine

## 2015-01-23 DIAGNOSIS — R7989 Other specified abnormal findings of blood chemistry: Secondary | ICD-10-CM | POA: Insufficient documentation

## 2015-01-26 ENCOUNTER — Other Ambulatory Visit: Payer: Self-pay | Admitting: Internal Medicine

## 2015-01-26 DIAGNOSIS — E049 Nontoxic goiter, unspecified: Secondary | ICD-10-CM

## 2015-02-04 ENCOUNTER — Other Ambulatory Visit: Payer: Self-pay | Admitting: Ophthalmology

## 2015-02-04 DIAGNOSIS — H5022 Vertical strabismus, left eye: Secondary | ICD-10-CM

## 2015-02-15 ENCOUNTER — Ambulatory Visit
Admission: RE | Admit: 2015-02-15 | Discharge: 2015-02-15 | Disposition: A | Discharge status: Home / Self Care | Payer: PRIVATE HEALTH INSURANCE | Source: Ambulatory Visit | Attending: Ophthalmology | Admitting: Ophthalmology

## 2015-02-15 DIAGNOSIS — H5022 Vertical strabismus, left eye: Secondary | ICD-10-CM

## 2015-02-15 MED ORDER — GADOBENATE DIMEGLUMINE 529 MG/ML IV SOLN
17.0000 mL | Freq: Once | INTRAVENOUS | Status: AC | PRN
  Administered 2015-02-15: 17 mL via INTRAVENOUS

## 2015-03-11 ENCOUNTER — Encounter: Payer: Self-pay | Admitting: Family Medicine

## 2015-03-11 ENCOUNTER — Ambulatory Visit (INDEPENDENT_AMBULATORY_CARE_PROVIDER_SITE_OTHER): Payer: PRIVATE HEALTH INSURANCE | Admitting: Family Medicine

## 2015-03-11 VITALS — BP 110/82 | HR 71 | Temp 98.3°F | Wt 182.0 lb

## 2015-03-11 DIAGNOSIS — E039 Hypothyroidism, unspecified: Secondary | ICD-10-CM

## 2015-03-11 DIAGNOSIS — E291 Testicular hypofunction: Secondary | ICD-10-CM | POA: Diagnosis not present

## 2015-03-11 DIAGNOSIS — G4700 Insomnia, unspecified: Secondary | ICD-10-CM | POA: Insufficient documentation

## 2015-03-11 DIAGNOSIS — H532 Diplopia: Secondary | ICD-10-CM

## 2015-03-11 DIAGNOSIS — Z7251 High risk heterosexual behavior: Secondary | ICD-10-CM

## 2015-03-11 DIAGNOSIS — K5150 Left sided colitis without complications: Secondary | ICD-10-CM

## 2015-03-11 DIAGNOSIS — R7989 Other specified abnormal findings of blood chemistry: Secondary | ICD-10-CM

## 2015-03-11 NOTE — Assessment & Plan Note (Signed)
S: has experienced fatigue for sometime. Got his own labs done to evaluate and both free T and testosterone total were low A/P: we discussed fasting repeat between 8-10 Am at our lab. If low, would need rectal and PSA before starting therapy with repeat eval after starting.

## 2015-03-11 NOTE — Assessment & Plan Note (Signed)
S: follows with Dr. Fuller Plan. Oral mesalamine 823m 3 tabs in AM, 2 in PM. Initial diagnosis in 2008. Symptoms have been controlled on this regimen with last colonoscopy in 2014 A/P: continue current rx and GI follow up

## 2015-03-11 NOTE — Assessment & Plan Note (Signed)
S: previously Levothyroxine 100 mcg daily. just upped dose a month ago to 1 tablet. Extra 1/2 tablet on Tues, Thursday, Sunday. With nontoxic nodular goiter, Dr. Linna Darner set TSH goal of 1.  Lab Results  Component Value Date   TSH 2.15 10/15/2014  A/P: repeat TSH in 2 more months

## 2015-03-11 NOTE — Progress Notes (Signed)
Tanner Reddish, MD Phone: [phone]  Subjective:  Patient presents today to establish care with me as their new primary care provider. Patient was formerly a patient of Dr. Linna Darner. Chief complaint-noted.   See problem oriented charting ROS-No hair or nail changes. No heat/cold intolerance. No constipation or diarrhea. Denies shakiness or anxiety.   The following were reviewed and entered/updated in epic: Past Medical History  Diagnosis Date  . MRSA infection 2009    OS; Dr Lucita Ferrara  . Heart murmur     slight, likely since childhood  . GERD (gastroesophageal reflux disease)   . Rosacea   . Condyloma acuminata   . Hypothyroidism    Patient Active Problem List   Diagnosis Date Noted  . Left sided ulcerative (chronic) colitis ([place]) 05/30/2012    Priority: High  . Insomnia 03/11/2015    Priority: Medium  . 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 Diplopia 01/14/2015    Priority: Medium  . 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 Hypothyroidism 03/27/2007    Priority: Medium  . Rosacea 07/21/2010    Priority: Low  . Tinnitus 04/08/2009    Priority: Low   Past Surgical History  Procedure Laterality Date  . Tonsillectomy    . Lasik Bilateral 2001  . Colonoscopy  2002 , 2008, 2014    colitis; Dr Fuller Plan  . Biopsy thyroid  11/07/12    FNA: goiter    Family History  Problem Relation Age of Onset  . Osteoarthritis Father   . Hypothyroidism Mother     also ocular tumor  . Cancer Maternal Uncle     intra-abdominal  . Colon polyps Paternal Uncle   . Hypertension Maternal Grandfather   . Lung cancer Maternal Grandfather     smoker  . Thyroid disease Maternal Grandmother     hypothyroidism  . Heart disease Paternal Uncle     valvular heart disease  . Colon cancer Maternal Uncle   . Diverticulitis Paternal Grandmother   . Colon cancer Paternal Grandmother 44  . Esophageal cancer Neg Hx   . Stomach cancer Neg Hx   . Rectal  cancer Neg Hx   . Diabetes Neg Hx   . Pancreatic cancer Paternal Uncle     Medications- reviewed and updated Current Outpatient Prescriptions  Medication Sig Dispense Refill  . levothyroxine (SYNTHROID, LEVOTHROID) 100 MCG tablet 100 mcg daily except for Tues ,Thurs& Sun take 1 1/2 (155mg) 115 tablet 3  . Mesalamine (ASACOL HD) 800 MG TBEC Take 3 tablets (2,400 mg total) by mouth 2 (two) times daily. 180 tablet 11  . Sulfacetamide-Sulfur-Sunscreen (PRASCION RA) 10-5 % CREA Apply topically daily.    . clonazePAM (KLONOPIN) 0.5 MG tablet 1 qhs prn (Patient not taking: Reported on 03/11/2015) 30 tablet 1   Allergies-reviewed and updated Allergies  Allergen Reactions  . Doxycycline     hives    Social History   Social History  . Marital Status: Married    Spouse Name: N/A  . Number of Children: 1  . Years of Education: N/A   Occupational History  . Engineer   .     Social History Main Topics  . Smoking status: Never Smoker   . Smokeless tobacco: Never Used  . Alcohol Use: Yes     Comment: occasionally  . Drug Use: No  . Sexual Activity: Not on file   Other Topics Concern  . Not on file   Social History Narrative  Married. 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 Oropharynx normal Neck: Thyromegaly noted with nodule left side about 2 cm in size CV: RRR no murmurs rubs or gallops Lungs: CTAB no crackles, wheeze, rhonchi Abdomen: soft/nontender/nondistended/normal bowel sounds. No rebound or guarding.  Ext: no edema Skin: warm, dry, no rash Neuro: grossly normal, moves all extremities, PERRLA   Assessment/Plan:  Diplopia S:Dr. Stonecipher. Infecction  left eye- MRSA or herpetic- attached to flap from lasik surgery. Treated with vancomycin and was told would have corneal scarring. Had some vertigo with head movement. Covered 1 eye and shook head and no issues. Was told to get hard contact lens. Double vision even with machine. Sees Dr. Annamaria Boots of pediatric opthalmology. Concern myasthenia gravis because worsening pattern- labs were negative but does not rule out. MRI brain and orbits was negative. A/P: Lined up to see [facility]- plan to be seen in February. Discussed with patient I have little else that I can add- appears has had appropriate workup and is a difficult case.    Hypothyroidism S: previously Levothyroxine 100 mcg daily. just upped dose a month ago to 1 tablet. Extra 1/2 tablet on Tues, Thursday, Sunday. With nontoxic nodular goiter, Dr. Linna Darner set TSH goal of 1.  Lab Results  Component Value Date   TSH 2.15 10/15/2014  A/P: repeat TSH in 2 more months   Low serum testosterone S: has experienced fatigue for sometime. Got his own labs done to evaluate and both free T and testosterone total were low A/P: we discussed fasting repeat between 8-10 Am at our lab. If low, would need rectal and PSA before starting therapy with repeat eval after starting.    Left sided ulcerative (chronic) colitis ([place]) S: follows with Dr. Fuller Plan. Oral mesalamine 83m 3 tabs in AM, 2 in PM. Initial diagnosis in 2008. Symptoms have been controlled on this regimen with last colonoscopy in 2014 A/P: continue current rx and GI follow up    Return precautions advised. See at least yearly sooner if testosterone low again  Future labs 2 months Orders Placed This Encounter  Procedures  . TSH    [place]    Standing Status: Future     Number of Occurrences:      Standing Expiration Date: 03/11/2016  . Testosterone, Free, Total, [facility]    Standing Status: Future     Number of Occurrences:      Standing Expiration Date: 03/11/2016  . HIV antibody     [facility]    Standing Status: Future     Number of Occurrences:      Standing Expiration Date: 03/10/2016

## 2015-03-11 NOTE — Assessment & Plan Note (Signed)
S:Dr. Lucita Ferrara. Infecction left eye- MRSA or herpetic- attached to flap from lasik surgery. Treated with vancomycin and was told would have corneal scarring. Had some vertigo with head movement. Covered 1 eye and shook head and no issues. Was told to get hard contact lens. Double vision even with machine. Sees Dr. Annamaria Boots of pediatric opthalmology. Concern myasthenia gravis because worsening pattern- labs were negative but does not rule out. MRI brain and orbits was negative. A/P: Lined up to see [facility]- plan to be seen in February. Discussed with patient I have little else that I can add- appears has had appropriate workup and is a difficult case.

## 2015-03-11 NOTE — Patient Instructions (Addendum)
Schedule a lab visit at the front desk for 2 months. Return for future fasting labs. Nothing but water after midnight please. Check thyroid, testosterone, 1x HIV for universal screening recommendations.   No changes today. Looking for TSH around 1  Definitely want to see you at least yearly

## 2015-06-17 ENCOUNTER — Encounter: Payer: Self-pay | Admitting: Gastroenterology

## 2015-07-09 ENCOUNTER — Ambulatory Visit (INDEPENDENT_AMBULATORY_CARE_PROVIDER_SITE_OTHER): Payer: Managed Care, Other (non HMO) | Admitting: Adult Health

## 2015-07-09 ENCOUNTER — Other Ambulatory Visit (INDEPENDENT_AMBULATORY_CARE_PROVIDER_SITE_OTHER): Payer: Managed Care, Other (non HMO)

## 2015-07-09 ENCOUNTER — Encounter: Payer: Self-pay | Admitting: Adult Health

## 2015-07-09 ENCOUNTER — Other Ambulatory Visit: Payer: Self-pay | Admitting: Family Medicine

## 2015-07-09 VITALS — BP 102/70 | HR 84 | Temp 97.8°F | Ht 71.0 in | Wt 183.1 lb

## 2015-07-09 DIAGNOSIS — R7989 Other specified abnormal findings of blood chemistry: Secondary | ICD-10-CM

## 2015-07-09 DIAGNOSIS — R6889 Other general symptoms and signs: Secondary | ICD-10-CM | POA: Diagnosis not present

## 2015-07-09 DIAGNOSIS — Z7251 High risk heterosexual behavior: Secondary | ICD-10-CM

## 2015-07-09 DIAGNOSIS — E039 Hypothyroidism, unspecified: Secondary | ICD-10-CM

## 2015-07-09 LAB — POCT INFLUENZA A/B
Influenza A, POC: NEGATIVE
Influenza B, POC: NEGATIVE

## 2015-07-09 LAB — TSH: TSH: 1.09 u[IU]/mL (ref 0.35–4.50)

## 2015-07-09 NOTE — Progress Notes (Signed)
Pre visit review using our clinic review tool, if applicable. No additional management support is needed unless otherwise documented below in the visit note. 

## 2015-07-09 NOTE — Progress Notes (Signed)
Subjective:    Patient ID: Tanner White, male    DOB: 1966/04/01, 50 y.o.   MRN: [id number]  HPI  50 year old male, patient of Dr. Yong Channel, presents to the office today for exposure to flu. He works around people who have tested positive for the flu and also works around a chemo patient. So he just wants to make sure he is not exposing that patient to the flu.   His symptoms include cough, " feverish feeling" chills, headache, fatigue, and sinus pain and headache x 2 days.   Denies any nausea, vomiting, diarrhea.   He has been using Zyrtec D which is helping with his symptoms.   Review of Systems  Constitutional: Positive for fever, chills and fatigue.  HENT: Positive for sinus pressure and sore throat. Negative for ear discharge, ear pain, postnasal drip and rhinorrhea.   Eyes: Negative.   Respiratory: Positive for cough. Negative for shortness of breath and wheezing.   Cardiovascular: Negative.   Musculoskeletal: Positive for myalgias.  Neurological: Positive for headaches.  All other systems reviewed and are negative.  Past Medical History  Diagnosis Date  . MRSA infection 2009    OS; Dr Lucita Ferrara  . Heart murmur     slight, likely since childhood  . GERD (gastroesophageal reflux disease)   . Rosacea   . Condyloma acuminata   . Hypothyroidism     Social History   Social History  . Marital Status: Married    Spouse Name: N/A  . Number of Children: 1  . Years of Education: N/A   Occupational History  . Engineer   .     Social History Main Topics  . Smoking status: Never Smoker   . Smokeless tobacco: Never Used  . Alcohol Use: Yes     Comment: occasionally  . Drug Use: No  . Sexual Activity: Not on file   Other Topics Concern  . Not on file   Social History Narrative   Married. 90 child 84 years old in 2016      Family owns [place] for [place]: bike riding- road and mountain, hiking, shoot  clays    Past Surgical History  Procedure Laterality Date  . Tonsillectomy    . Lasik Bilateral 2001  . Colonoscopy  2002 , 2008, 2014    colitis; Dr Fuller Plan  . Biopsy thyroid  11/07/12    FNA: goiter    Family History  Problem Relation Age of Onset  . Osteoarthritis Father   . Hypothyroidism Mother     also ocular tumor  . Cancer Maternal Uncle     intra-abdominal  . Colon polyps Paternal Uncle   . Hypertension Maternal Grandfather   . Lung cancer Maternal Grandfather     smoker  . Thyroid disease Maternal Grandmother     hypothyroidism  . Heart disease Paternal Uncle     valvular heart disease  . Colon cancer Maternal Uncle   . Diverticulitis Paternal Grandmother   . Colon cancer Paternal Grandmother 68  . Esophageal cancer Neg Hx   . Stomach cancer Neg Hx   . Rectal cancer Neg Hx   . Diabetes Neg Hx   . Pancreatic cancer Paternal Uncle     Allergies  Allergen Reactions  . Doxycycline     hives    Current Outpatient Prescriptions on File Prior to Visit  Medication Sig Dispense Refill  . clonazePAM (KLONOPIN) 0.5  MG tablet 1 qhs prn 30 tablet 1  . levothyroxine (SYNTHROID, LEVOTHROID) 100 MCG tablet 100 mcg daily except for Tues ,Thurs& Sun take 1 1/2 (156mg) 115 tablet 3  . Mesalamine (ASACOL HD) 800 MG TBEC Take 3 tablets (2,400 mg total) by mouth 2 (two) times daily. 180 tablet 11  . Sulfacetamide-Sulfur-Sunscreen (PRASCION RA) 10-5 % CREA Apply topically daily.     No current facility-administered medications on file prior to visit.    BP 102/70 mmHg  Pulse 84  Temp(Src) 97.8 F (36.6 C) (Oral)  Ht 5' 11"  (1.803 m)  Wt 183 lb 1.6 oz (83.054 kg)  BMI 25.55 kg/m2  SpO2 99%       Objective:   Physical Exam  Constitutional: He is oriented to person, place, and time. He appears well-developed and well-nourished. No distress.  HENT:  Head: Normocephalic and atraumatic.  Right Ear: External ear normal.  Left Ear: External ear normal.  Nose: Nose  normal.  Mouth/Throat: Oropharynx is clear and moist. No oropharyngeal exudate.  Cardiovascular: Normal rate, regular rhythm, normal heart sounds and intact distal pulses.  Exam reveals no gallop and no friction rub.   No murmur heard. Pulmonary/Chest: Effort normal and breath sounds normal. No respiratory distress. He has no wheezes. He has no rales. He exhibits no tenderness.  Musculoskeletal: Normal range of motion. He exhibits no edema or tenderness.  Neurological: He is alert and oriented to person, place, and time.  Skin: Skin is warm and dry. No rash noted. He is not diaphoretic. No erythema. No pallor.  Psychiatric: He has a normal mood and affect. His behavior is normal. Judgment and thought content normal.  Nursing note and vitals reviewed.     Assessment & Plan:  1. Flu-like symptoms - Flu swab negative  - Likely viral or seasonal allergies.  - Follow up if no improvement.

## 2015-07-09 NOTE — Patient Instructions (Signed)
It was great meeting you this morning.   Your flu swab came back negative.   You more than likely still have a viral illness. Stay hydrated and get as much rest as possible.   You can take tylenol or ibuprofen for the body aches and fevers.   Follow up with PCP if no improvement in the next 2-3 days

## 2015-07-09 NOTE — Addendum Note (Signed)
Addended by: Colleen Can on: 07/09/2015 09:02 AM   Modules accepted: Orders

## 2015-07-10 LAB — TESTOSTERONE, FREE AND TOTAL (INCLUDES SHBG)-(MALES)
Sex Hormone Binding: 28 nmol/L (ref 10–50)
TESTOSTERONE: 233 ng/dL — AB (ref 250–827)
Testosterone, Free: 49.2 pg/mL (ref 47.0–244.0)
Testosterone-% Free: 2.1 % (ref 1.6–2.9)

## 2015-07-10 LAB — HIV ANTIBODY (ROUTINE TESTING W REFLEX): HIV 1&2 Ab, 4th Generation: NONREACTIVE

## 2015-09-08 ENCOUNTER — Telehealth: Payer: Self-pay | Admitting: Gastroenterology

## 2015-09-08 NOTE — Telephone Encounter (Signed)
Patient states his insurance company no longer covers Asacol. The pharmacist at [facility] mentioned a generic mesalamine dr 800 mg tablet that is covered and the manufacturer is Zydus. I informed patient I have not heard of a mesalamine generic. I informed patient that I will ask Dr. Fuller Plan if we can send this in to his pharmacy and make sure it's the same dose. Informed patient that it should be but I will just confirm. Please advise Dr. Fuller Plan.

## 2015-09-08 NOTE — Telephone Encounter (Signed)
I am not familiar with this generic, must be relatively new. He should try it as same dose and frequency as Asacol.

## 2015-09-09 MED ORDER — AMBULATORY NON FORMULARY MEDICATION: Status: DC

## 2015-09-09 NOTE — Telephone Encounter (Signed)
Called patient and notified him that I will [facility] dr 875m [facility] Also told him to call if he has any issues or the pharmacy has a problem with the prescription since that particular medication will not come up in our electronic system. Pt verbalized understanding.

## 2015-09-10 NOTE — Telephone Encounter (Signed)
Called patient to inform him we have received a fax from pharmacy stating mesalamine is not covered and is too expensive. Patient states he has not went by the pharmacy yet but will find out why this is not covered and also see what is covered. Patient states he has tried several medications in the past and is not even sure what else he can take. Informed patient that I will look back and see what he has tried before while awaiting his call back. Patient has tried: Lialda, Delzicol, Asacol, and Apriso.

## 2015-09-16 MED ORDER — MESALAMINE 1.2 G PO TBEC: 2.4000 g | DELAYED_RELEASE_TABLET | Freq: Every day | ORAL | Status: DC

## 2015-09-16 NOTE — Addendum Note (Signed)
Addended by: Marzella Schlein on: 09/16/2015 03:26 PM   Modules accepted: Orders, Medications

## 2015-09-16 NOTE — Telephone Encounter (Signed)
Lialda 2.4g daily

## 2015-09-16 NOTE — Telephone Encounter (Signed)
Patient states his insurance requires for him to try Lialda, basalazide, or Apriso. Patient states he does remember not tolerating Apriso very well. He states he does not remember if he stopped Lialda due to insurance reasons but is willing to try again. Informed patient that I will check with Dr. Fuller Plan to see what dose of Lialda he should be on and send it to his pharmacy. Please advise Dr. Fuller Plan.

## 2015-11-02 ENCOUNTER — Ambulatory Visit: Payer: Managed Care, Other (non HMO) | Admitting: Gastroenterology

## 2015-11-10 ENCOUNTER — Ambulatory Visit: Payer: Managed Care, Other (non HMO) | Admitting: Gastroenterology

## 2015-11-27 ENCOUNTER — Encounter: Payer: Managed Care, Other (non HMO) | Admitting: Gastroenterology

## 2015-12-04 ENCOUNTER — Encounter: Payer: Managed Care, Other (non HMO) | Admitting: Gastroenterology

## 2016-02-05 ENCOUNTER — Other Ambulatory Visit: Payer: Self-pay | Admitting: Internal Medicine

## 2016-02-05 NOTE — Telephone Encounter (Signed)
Routing to patient's new pcp, dr Retail banker, to handle

## 2016-02-24 ENCOUNTER — Telehealth: Payer: Self-pay | Admitting: Family Medicine

## 2016-02-24 NOTE — Telephone Encounter (Signed)
Pt has a family history of prostate cancer. Can I schedule PSA test and testosterone? Pt has a cpx sch for 05-26-16

## 2016-02-25 ENCOUNTER — Other Ambulatory Visit: Payer: Self-pay | Admitting: Gastroenterology

## 2016-02-25 NOTE — Telephone Encounter (Signed)
I am not sure how the testosterone is relevant to prostate cancer history- I would want to discuss this test in person. I dont mind a psa being ordered under family history prostate cancer but he could also just ask for this before physical labs

## 2016-02-26 ENCOUNTER — Other Ambulatory Visit: Payer: Self-pay

## 2016-02-26 DIAGNOSIS — Z8043 Family history of malignant neoplasm of testis: Secondary | ICD-10-CM

## 2016-02-26 DIAGNOSIS — E349 Endocrine disorder, unspecified: Secondary | ICD-10-CM

## 2016-02-26 DIAGNOSIS — E039 Hypothyroidism, unspecified: Secondary | ICD-10-CM

## 2016-02-26 NOTE — Telephone Encounter (Signed)
Thanks [name]! I didn't realize that from first note.   Tanner White- both can be added to CPE labs. Would want the free and total testosterone

## 2016-02-26 NOTE — Telephone Encounter (Signed)
Pt is aware labs has been added

## 2016-02-26 NOTE — Telephone Encounter (Signed)
Forgot to send to [facility]- adding on

## 2016-02-26 NOTE — Telephone Encounter (Signed)
I entered future orders for a PSA and Free & Total Testosterone in the computer for this patient.

## 2016-02-26 NOTE — Telephone Encounter (Addendum)
Pt testosterone level was 233  In 3-2017and per note to repeat at next cpx

## 2016-03-28 ENCOUNTER — Ambulatory Visit (INDEPENDENT_AMBULATORY_CARE_PROVIDER_SITE_OTHER): Payer: Managed Care, Other (non HMO) | Admitting: Internal Medicine

## 2016-03-28 DIAGNOSIS — Z23 Encounter for immunization: Secondary | ICD-10-CM

## 2016-03-31 LAB — TB SKIN TEST
Induration: 0 mm
TB SKIN TEST: NEGATIVE

## 2016-04-14 ENCOUNTER — Ambulatory Visit: Payer: Managed Care, Other (non HMO) | Admitting: Gastroenterology

## 2016-05-11 ENCOUNTER — Other Ambulatory Visit: Payer: Self-pay | Admitting: Family Medicine

## 2016-05-19 ENCOUNTER — Other Ambulatory Visit (INDEPENDENT_AMBULATORY_CARE_PROVIDER_SITE_OTHER): Payer: Managed Care, Other (non HMO)

## 2016-05-19 DIAGNOSIS — E039 Hypothyroidism, unspecified: Secondary | ICD-10-CM | POA: Diagnosis not present

## 2016-05-19 DIAGNOSIS — Z8043 Family history of malignant neoplasm of testis: Secondary | ICD-10-CM | POA: Diagnosis not present

## 2016-05-19 DIAGNOSIS — E349 Endocrine disorder, unspecified: Secondary | ICD-10-CM

## 2016-05-19 LAB — LIPID PANEL
CHOL/HDL RATIO: 4
CHOLESTEROL: 185 mg/dL (ref 0–200)
HDL: 41.4 mg/dL (ref >39.00)
NonHDL: 143.55
Triglycerides: 234 mg/dL — ABNORMAL HIGH (ref 0.0–149.0)
VLDL: 46.8 mg/dL — AB (ref 0.0–40.0)

## 2016-05-19 LAB — BASIC METABOLIC PANEL
BUN: 18 mg/dL (ref 6–23)
CALCIUM: 9.3 mg/dL (ref 8.4–10.5)
CO2: 30 mEq/L (ref 19–32)
CREATININE: 1.05 mg/dL (ref 0.40–1.50)
Chloride: 105 mEq/L (ref 96–112)
GFR: 79.99 mL/min (ref >60.00)
Glucose, Bld: 100 mg/dL — ABNORMAL HIGH (ref 70–99)
Potassium: 4.2 mEq/L (ref 3.5–5.1)
Sodium: 141 mEq/L (ref 135–145)

## 2016-05-19 LAB — HEPATIC FUNCTION PANEL
ALT: 16 U/L (ref 0–53)
AST: 14 U/L (ref 0–37)
Albumin: 4.3 g/dL (ref 3.5–5.2)
Alkaline Phosphatase: 44 U/L (ref 39–117)
BILIRUBIN DIRECT: 0.1 mg/dL (ref 0.0–0.3)
BILIRUBIN TOTAL: 0.6 mg/dL (ref 0.2–1.2)
Total Protein: 6.7 g/dL (ref 6.0–8.3)

## 2016-05-19 LAB — CBC
HCT: 43 % (ref 39.0–52.0)
Hemoglobin: 14.7 g/dL (ref 13.0–17.0)
MCHC: 34.3 g/dL (ref 30.0–36.0)
MCV: 89.1 fl (ref 78.0–100.0)
Platelets: 179 10*3/uL (ref 150.0–400.0)
RBC: 4.82 Mil/uL (ref 4.22–5.81)
RDW: 14.5 % (ref 11.5–15.5)
WBC: 5.3 10*3/uL (ref 4.0–10.5)

## 2016-05-19 LAB — TSH: TSH: 1.96 u[IU]/mL (ref 0.35–4.50)

## 2016-05-19 LAB — PSA: PSA: 0.59 ng/mL (ref 0.10–4.00)

## 2016-05-19 LAB — LDL CHOLESTEROL, DIRECT: Direct LDL: 85 mg/dL

## 2016-05-20 LAB — TESTOSTERONE,FREE AND TOTAL
Testosterone, Free: 9.5 pg/mL (ref 6.8–21.5)
Testosterone: 290 ng/dL (ref 264–916)

## 2016-05-23 ENCOUNTER — Encounter: Payer: Self-pay | Admitting: Family Medicine

## 2016-05-23 LAB — TESTOSTERONE, FREE, TOTAL, SHBG
TESTOSTERONE FREE: 9.5
TESTOSTERONE: 290

## 2016-05-26 ENCOUNTER — Encounter: Payer: Self-pay | Admitting: Family Medicine

## 2016-05-26 ENCOUNTER — Ambulatory Visit (INDEPENDENT_AMBULATORY_CARE_PROVIDER_SITE_OTHER): Payer: Managed Care, Other (non HMO) | Admitting: Family Medicine

## 2016-05-26 VITALS — BP 102/70 | HR 69 | Temp 97.8°F | Ht 71.25 in | Wt 181.4 lb

## 2016-05-26 DIAGNOSIS — Z23 Encounter for immunization: Secondary | ICD-10-CM

## 2016-05-26 DIAGNOSIS — E049 Nontoxic goiter, unspecified: Secondary | ICD-10-CM

## 2016-05-26 DIAGNOSIS — H532 Diplopia: Secondary | ICD-10-CM

## 2016-05-26 DIAGNOSIS — K5150 Left sided colitis without complications: Secondary | ICD-10-CM | POA: Diagnosis not present

## 2016-05-26 DIAGNOSIS — Z0000 Encounter for general adult medical examination without abnormal findings: Secondary | ICD-10-CM | POA: Diagnosis not present

## 2016-05-26 MED ORDER — LEVOTHYROXINE SODIUM 100 MCG PO TABS: ORAL_TABLET | ORAL | 3 refills | Status: DC

## 2016-05-26 NOTE — Patient Instructions (Addendum)
Call GI for follow up   Flu shot today before you leave  Restart your exercise  If not feeling better with that- consider calling for counseling given high level of stress

## 2016-05-26 NOTE — Assessment & Plan Note (Signed)
Ulcerative colitis-  on mesalamine 88m 3 tabs in AM, 2 in PM. Busy schedule and has not followed up to see Dr. SFuller Plan advised him to do so. Has been out of medication but want to  Avoid flare up

## 2016-05-26 NOTE — Addendum Note (Signed)
Addended by: Lyndle Herrlich on: 05/26/2016 08:59 AM   Modules accepted: Orders

## 2016-05-26 NOTE — Addendum Note (Signed)
Addended by: Marin Olp on: 05/26/2016 08:53 AM   Modules accepted: Orders

## 2016-05-26 NOTE — Progress Notes (Addendum)
Phone: [phone]  Subjective:  Patient presents today for their annual physical. Chief complaint-noted.   See problem oriented charting- ROS- full  review of systems was completed and negative except for: diplopia which has improved/resolved on prednisone  The following were reviewed and entered/updated in epic: Past Medical History:  Diagnosis Date  . Condyloma acuminata   . GERD (gastroesophageal reflux disease)   . Heart murmur    slight, likely since childhood  . Hypothyroidism   . MRSA infection 2009   OS; Dr Lucita Ferrara  . Rosacea    Patient Active Problem List   Diagnosis Date Noted  . Left sided ulcerative (chronic) colitis ([place]) 05/30/2012    Priority: High  . Insomnia 03/11/2015    Priority: Medium  . 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 Diplopia 01/14/2015    Priority: Medium  . 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 Hypothyroidism 03/27/2007    Priority: Medium  . Rosacea 07/21/2010    Priority: Low  . Tinnitus 04/08/2009    Priority: Low   Past Surgical History:  Procedure Laterality Date  . BIOPSY THYROID  11/07/12   FNA: goiter  . COLONOSCOPY  2002 , 2008, 2014   colitis; Dr Fuller Plan  . LASIK Bilateral 2001  . TONSILLECTOMY      Family History  Problem Relation Age of Onset  . Osteoarthritis Father   . Hypothyroidism Mother     also ocular tumor  . Cancer Maternal Uncle     intra-abdominal  . Colon polyps Paternal Uncle   . Hypertension Maternal Grandfather   . Lung cancer Maternal Grandfather     smoker  . Thyroid disease Maternal Grandmother     hypothyroidism  . Heart disease Paternal Uncle     valvular heart disease  . Colon cancer Maternal Uncle   . Diverticulitis Paternal Grandmother   . Colon cancer Paternal Grandmother 38  . Esophageal cancer Neg Hx   . Stomach cancer Neg Hx   . Rectal cancer Neg Hx   . Diabetes Neg Hx   . Pancreatic cancer Paternal Uncle     Medications- reviewed  and updated Current Outpatient Prescriptions  Medication Sig Dispense Refill  . levothyroxine (SYNTHROID, LEVOTHROID) 100 MCG tablet 100 mcg daily except for Tues ,Thurs& Sun take 1 1/2 (161mg) 115 tablet 3  . mesalamine (LIALDA) 1.2 g EC tablet TAKE 2 TABLETS(2.4 GRAMS) BY MOUTH DAILY WITH BREAKFAST 60 tablet 0  . Sulfacetamide-Sulfur-Sunscreen (PRASCION RA) 10-5 % CREA Apply topically daily.     No current facility-administered medications for this visit.     Allergies-reviewed and updated Allergies  Allergen Reactions  . Doxycycline     hives    Social History   Social History  . Marital status: Married    Spouse name: N/A  . Number of children: 1  . Years of education: N/A   Occupational History  . Engineer   .  [employer]   Social History Main Topics  . Smoking status: Never Smoker  . Smokeless tobacco: Never Used  . Alcohol use Yes     Comment: occasionally  . Drug use: No  . Sexual activity: Not Asked   Other Topics Concern  . None   Social History Narrative   Married. 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 Oropharynx normal Neck: no thyromegaly but does have nodule under 2 x 2 cm left thyroid stable CV: RRR no murmurs rubs or gallops Lungs: CTAB no crackles, wheeze, rhonchi Abdomen: soft/nontender/nondistended/normal bowel sounds. No rebound or guarding. GU: normal testicular and penile exam  Ext: no edema Skin: warm, dry Neuro: grossly normal, moves all extremities, PERRLA Rectal: normal tone,  normal sized prostate, no masses or tenderness  Assessment/Plan:  51 y.o. male presenting for annual physical.  Health Maintenance counseling: 1. Anticipatory guidance: Patient counseled regarding regular dental exams, eye exams (ongoing issues diplopia) , wearing seatbelts.  2. Risk factor reduction:  Advised patient of need for regular exercise and diet rich and fruits and vegetables to reduce risk of heart attack and stroke. Very busy lately- exercise down. Knows he needs to restart 3. Immunizations/screenings/ancillary studies Immunization History  Administered Date(s) Administered  . Influenza Whole 03/27/2007, 04/08/2009  . Influenza,inj,Quad PF,36+ Mos 03/13/2013  . PPD Test 03/28/2016  . Td 05/17/2007   Health Maintenance Due  . INFLUENZA VACCINE - today as off prednisone 3 weeks 12/15/2015   4. Prostate cancer screening- low risk PSA trend and rectal exam  . Just had another uncle diagnosed this year with prostate cancer.  Lab Results  Component Value Date   PSA 0.59 05/19/2016   PSA 0.54 08/11/2011   PSA 0.35 12/21/2009   5. Colon cancer screening - through GI due to UC but not for colon cancer prevention. Last 06/2012- he is going to call about this 6. Skin cancer screening- goes once a year Dr. [place] Crane  Status of chronic or acute concerns   Hyperglycemia and hypertriglyceridemia- new this year, focus on restarting exercise and follow up in 1 year  Low testosterone - low normal range. Would not advise treatment  High level of stresss- wants to consider seeing behavioral health  Non-toxic nodular goiter S:11/07/12 FNA  Left thyroid about 2x 2 x 1 cm. Has been on levothyroxine 100 mcg for several years (does take 1.5 on tues/thurs/sunday as well) "Notes Recorded by Hendricks Limes, MD on 11/08/2012 at 5:50 PM NON cancerous goiter on biopsy. TSH should be approximately 1.00 to prevent progression.Appropriate would be repeat ultrasound as clinically indicated by annual  monitor with manual examination & full thyroid function tests. [place]" A/P:No clear growth on CPE today. TSH near 2. Discussed option of increasing dose- he prefers to stay on same dose due to stability  Left sided ulcerative (chronic) colitis ([facility]) Ulcerative colitis-  on mesalamine 856m 3 tabs in AM, 2 in PM. Busy schedule and has not followed up to see Dr. SFuller Plan advised him to do so. Has been out of medication but want to  Avoid flare up  1 year CPE  Return precautions advised.   SGarret Reddish MD

## 2016-05-26 NOTE — Assessment & Plan Note (Addendum)
S:11/07/12 FNA  Left thyroid about 2x 2 x 1 cm. Has been on levothyroxine 100 mcg for several years (does take 1.5 on tues/thurs/sunday as well) "Notes Recorded by Hendricks Limes, MD on 11/08/2012 at 5:50 PM NON cancerous goiter on biopsy. TSH should be approximately 1.00 to prevent progression.Appropriate would be repeat ultrasound as clinically indicated by annual monitor with manual examination & full thyroid function tests. [place]" A/P:No clear growth on CPE today. TSH near 2. Discussed option of increasing dose- he prefers to stay on same dose due to stability

## 2016-05-26 NOTE — Progress Notes (Signed)
Pre visit review using our clinic review tool, if applicable. No additional management support is needed unless otherwise documented below in the visit note. 

## 2016-06-26 ENCOUNTER — Other Ambulatory Visit: Payer: Self-pay | Admitting: Gastroenterology

## 2016-08-17 IMAGING — US US SOFT TISSUE HEAD/NECK
1 series · 13 of 25 positions shown · non-contrast
Comparison: Prior thyroid ultrasound 10/29/2012; biopsy images 6
10/02/2012

CLINICAL DATA: 49-year-old male with thyroid nodules. Patient
underwent biopsy of the nodule in the mid medial inferior left
thyroid lobe on 11/07/2012.

EXAM:
THYROID ULTRASOUND
TECHNIQUE: Ultrasound examination of the thyroid gland and adjacent soft
tissues was performed.

[Series 1: us soft tissue head/neck · 0.07mm/px · 13 of 36 slices shown]
[im 1/36]
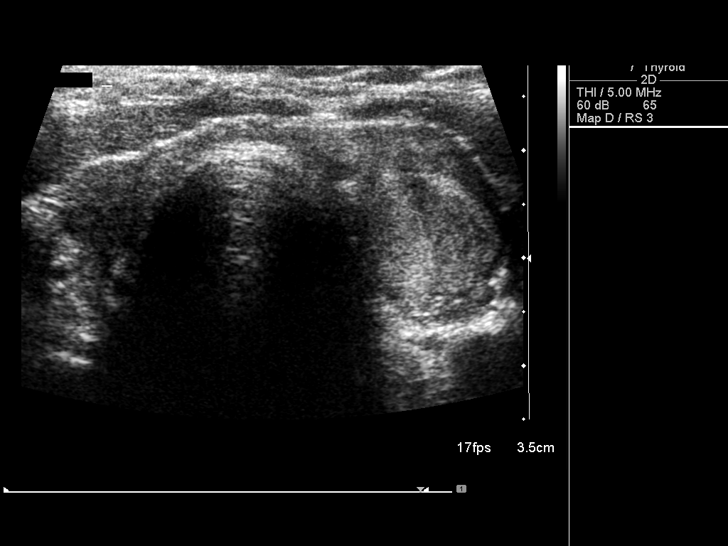
[im 3/36]
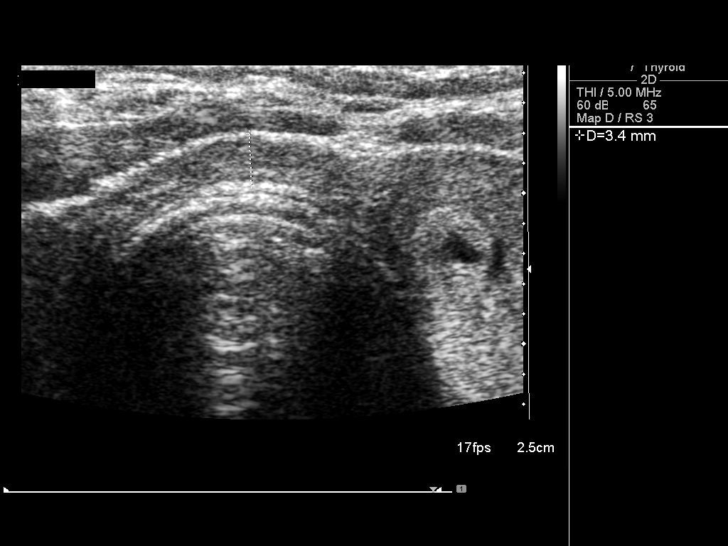
[im 6/36]
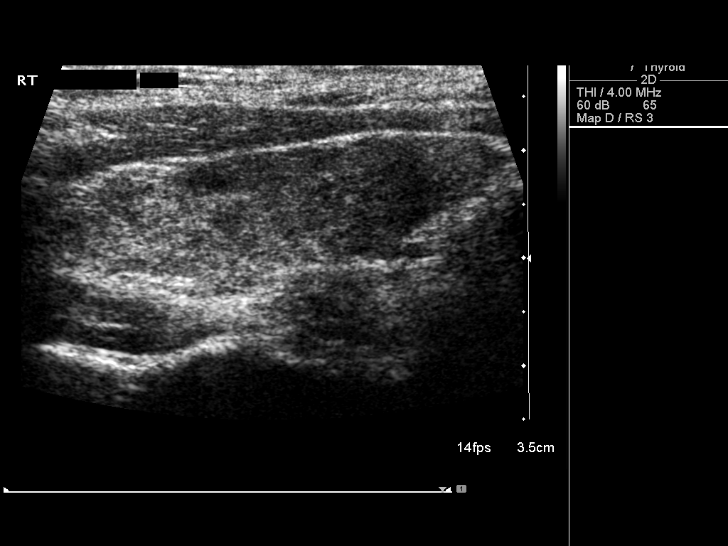
[im 9/36]
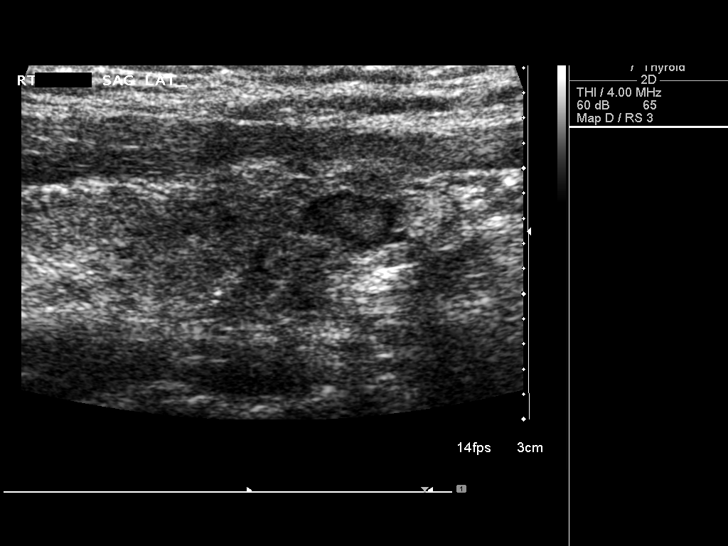
[im 12/36]
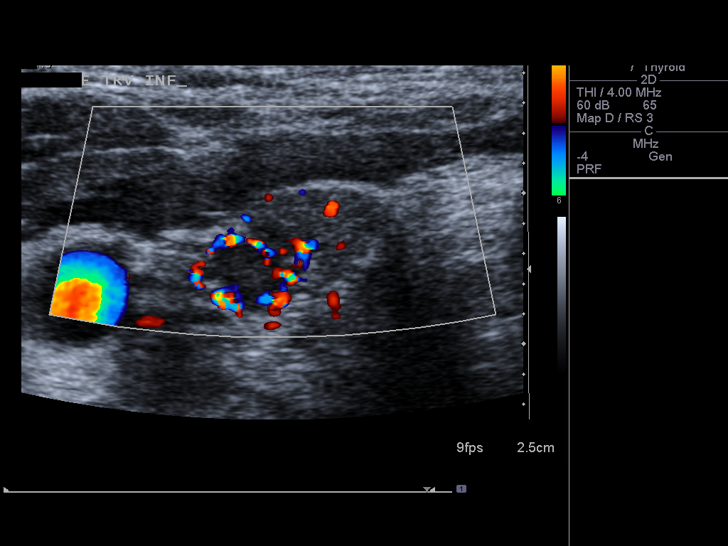
[im 15/36]
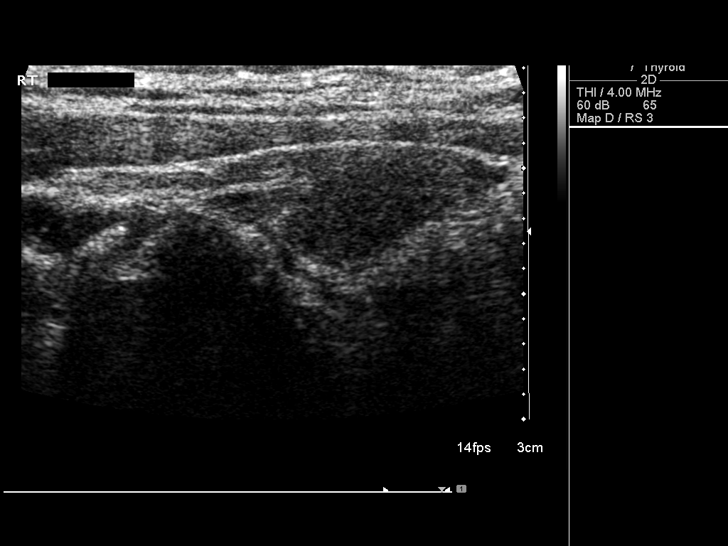
[im 18/36]
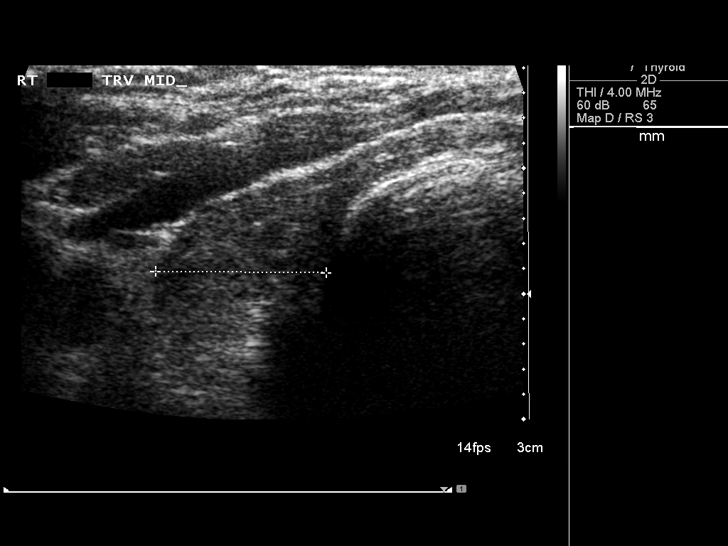
[im 21/36]
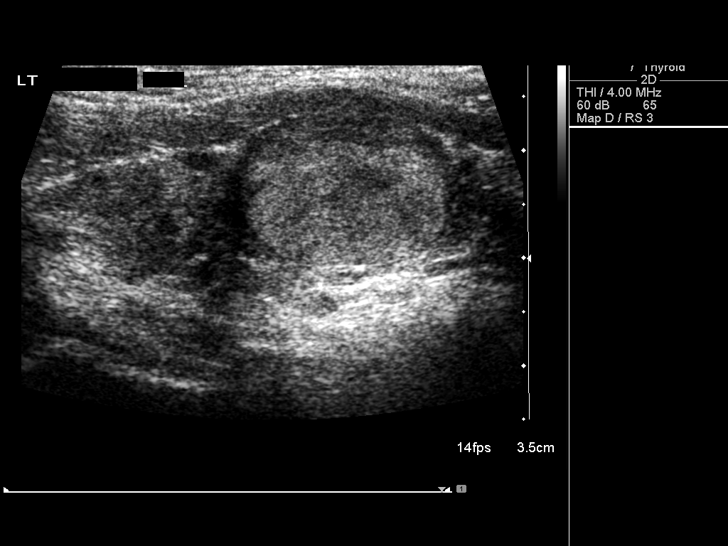
[im 24/36]
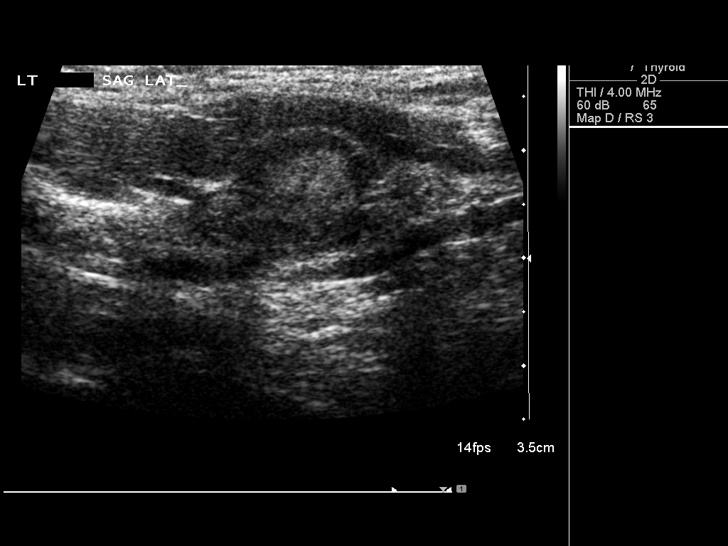
[im 27/36]
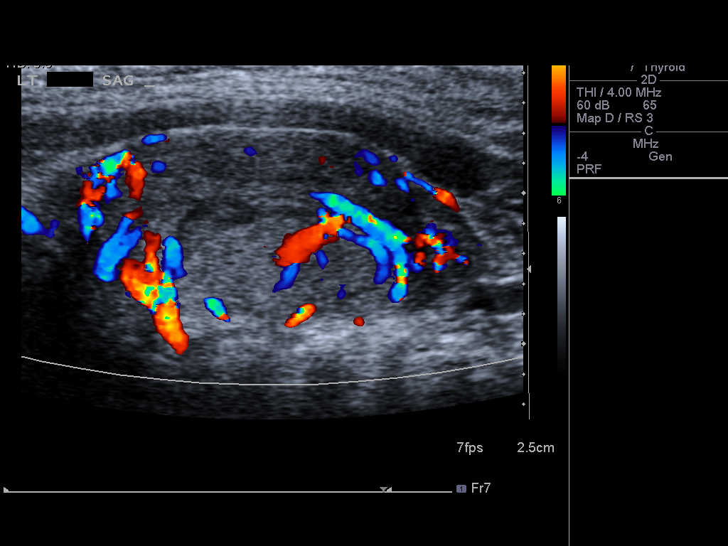
[im 30/36]
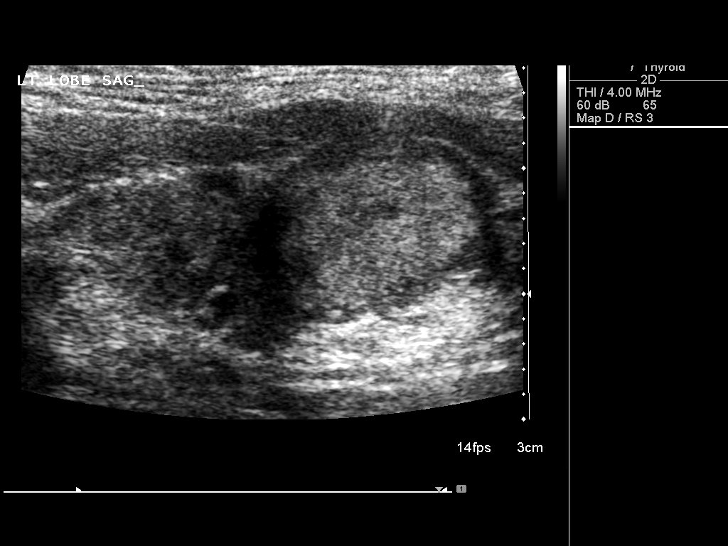
[im 33/36]
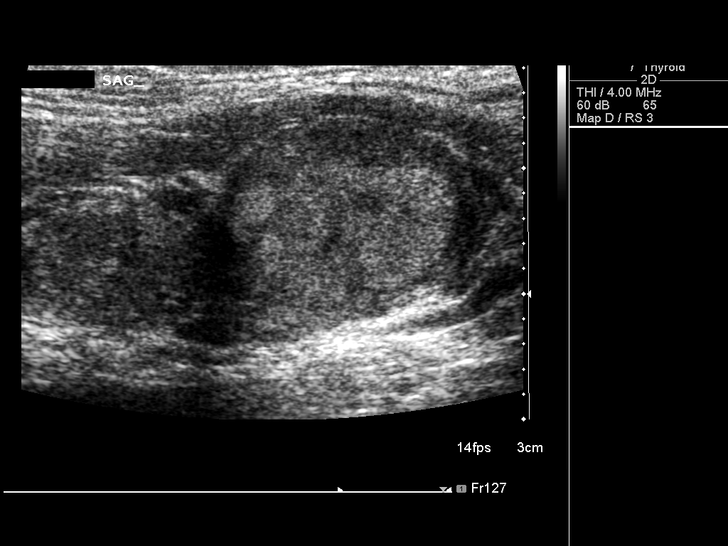
[im 36/36]
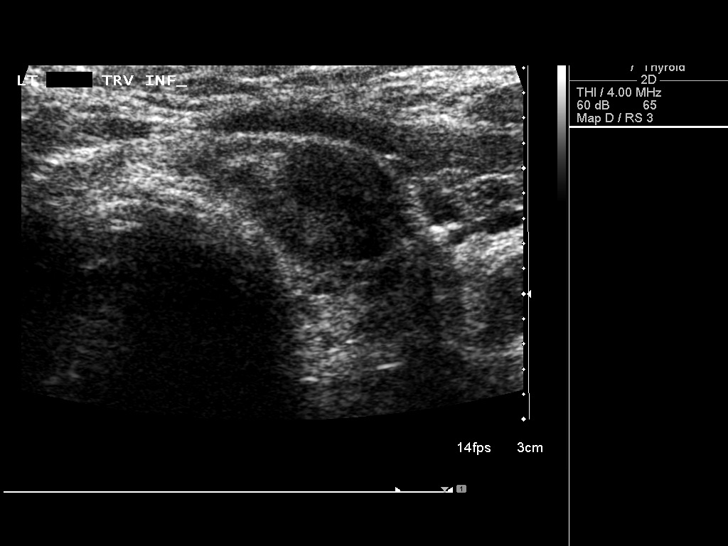

[13 of 25 positions shown; findings below may reference images not displayed]

FINDINGS: Right thyroid lobe

Measurements: 4.5 x 1.1 x 1.4 cm.

1. Small hypoechoic solid nodule in the inferior gland measures 8 x
6 x 5 mm. This has slightly decreased in size compared to 9 x 8 x 5
mm previously.
Left thyroid lobe

Measurements: 4.4 x 1.7 x 2.0 cm.

1. The dominant isoechoic solid nodule in the medial mid to lower
gland measures 22 x 13 x 17 mm which is perhaps minimally enlarged
compared to 20 x 12 x 18 mm.
Isthmus

Thickness: 0.3 cm.  No nodules visualized.

Lymphadenopathy

None visualized.
IMPRESSION: 1. Perhaps minimal interval growth of the previously biopsied
dominant nodule in the left gland over the 2 year interval since the
prior study. This is unlikely to be of clinical significance.
Recommend correlation with prior biopsy results.
2. Slightly decreased size of sub cm nodule in the inferior right
gland.

## 2016-08-29 ENCOUNTER — Telehealth: Payer: Self-pay | Admitting: Gastroenterology

## 2016-08-29 NOTE — Telephone Encounter (Signed)
Informed patient that we have not refilled this medication since 02/2016 and asked if he has been getting it refilled from another office. Patient states he is noncompliant and has not been taking it since the prescription ran our in 03/2016. Patient denies having any GI sx at this time but states he needs to start taking Lialda again so he does not have a flare. Patient states he understands how important is it to stay on the medication and they needs to been seen for routine follow-ups but "just hasn't". Informed patient that I will have to discuss this with Dr. Fuller Plan and I will call him. Dr. Fuller Plan, patient has an appt scheduled with you in May. Do you want him to be seen sooner than May with a PA and can I give him refills of Lialda until scheduled appt?

## 2016-08-29 NOTE — Telephone Encounter (Signed)
OK for Lialda refill until his appt. In May

## 2016-08-30 MED ORDER — MESALAMINE 1.2 G PO TBEC: DELAYED_RELEASE_TABLET | ORAL | 0 refills | Status: DC

## 2016-08-30 NOTE — Telephone Encounter (Signed)
Prescription sent to patient's pharmacy and patient notified to keep appt for any further refills. Patient verbalized understanding.

## 2016-09-01 NOTE — Progress Notes (Signed)
Unclear why encounter open. May have been linked to prior TB skin testing.

## 2016-09-10 IMAGING — MR MR ORBITS WO/W CM
11 of 16 series · 27 of 48 positions shown · IV contrast (multihance)
Comparison: None.

CLINICAL DATA: Strabismus for 1 year. Evaluate for intracranial or
orbital cause.

EXAM:
MRI HEAD AND ORBITS WITHOUT AND WITH CONTRAST
TECHNIQUE: Multiplanar, multiecho pulse sequences of the brain and surrounding
structures were obtained without and with intravenous contrast.
Multiplanar, multiecho pulse sequences of the orbits and surrounding
structures were obtained including fat saturation techniques, before
and after intravenous contrast administration.
CONTRAST:  17mL MULTIHANCE GADOBENATE DIMEGLUMINE 529 MG/ML IV SOLN

[Series 2: T1 · sagittal · 5.0mm · 0.45mm/px · 2 of 19 slices shown (1 of 3)]
[im 1/19]
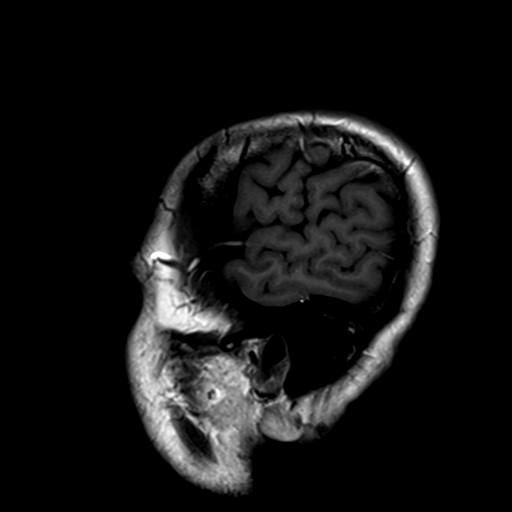
[im 19/19]
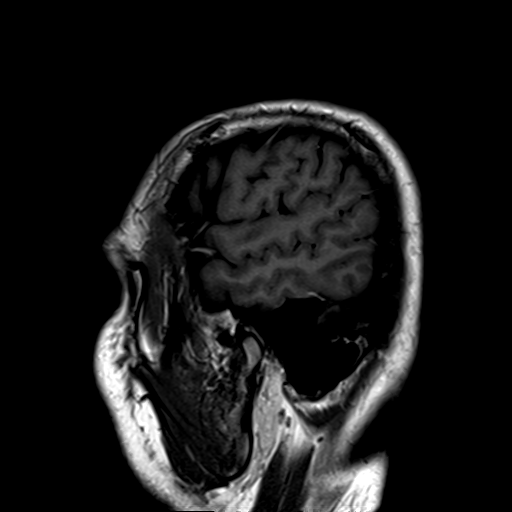

[Series 3: DWI · axial · 5.0mm · 1.80mm/px · z∈[-42,+111]mm · 5 of 48 slices shown (1 of 2)]
[im 1/48]
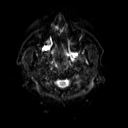
[im 12/48]
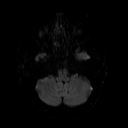
[im 24/48]
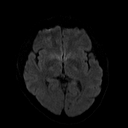
[im 36/48]
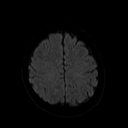
[im 48/48]
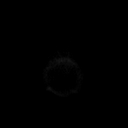

[Series 4: DWI · axial · 5.0mm · 1.80mm/px · z∈[-42,+111]mm · 2 of 24 slices shown (2 of 2)]
[im 1/24]
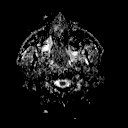
[im 24/24]
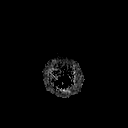

[Series 5: T2 · axial · 5.0mm · 0.51mm/px · z∈[-31,+115]mm · 2 of 23 slices shown (1 of 2)]
[im 1/23]
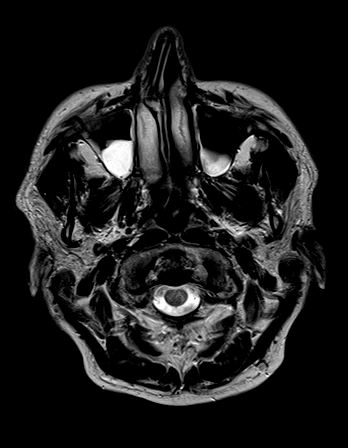
[im 23/23]
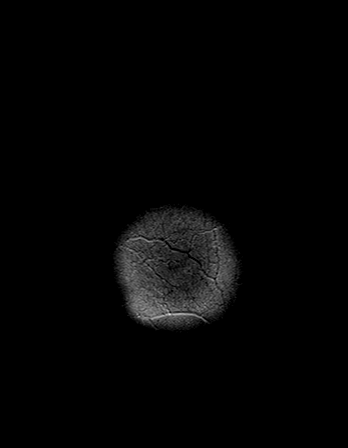

[Series 6: FLAIR · axial · 5.0mm · 0.45mm/px · z∈[-30,+115]mm · 2 of 23 slices shown]
[im 1/23]
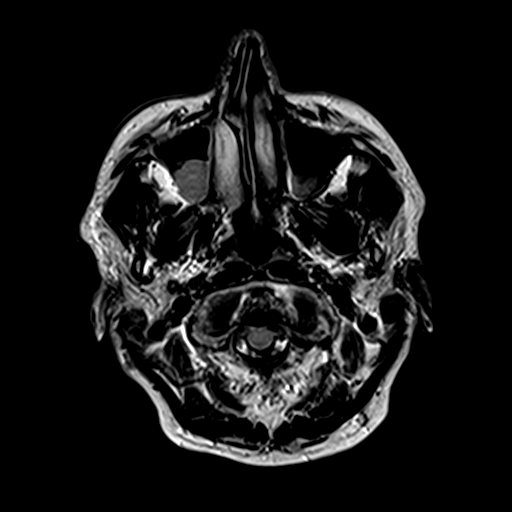
[im 23/23]
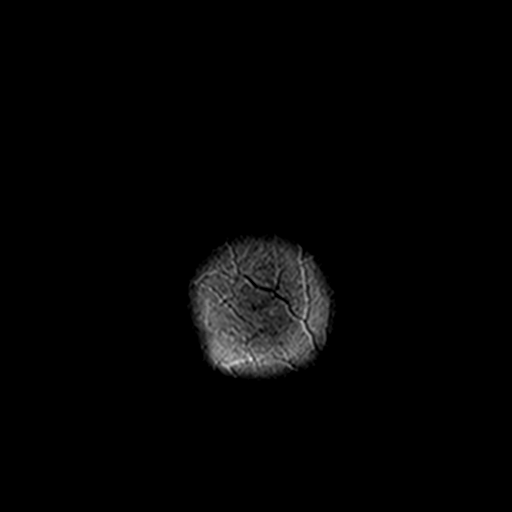

[Series 9: T1 · coronal · 3.0mm · 0.35mm/px · 3 of 28 slices shown (2 of 3)]
[im 1/28]
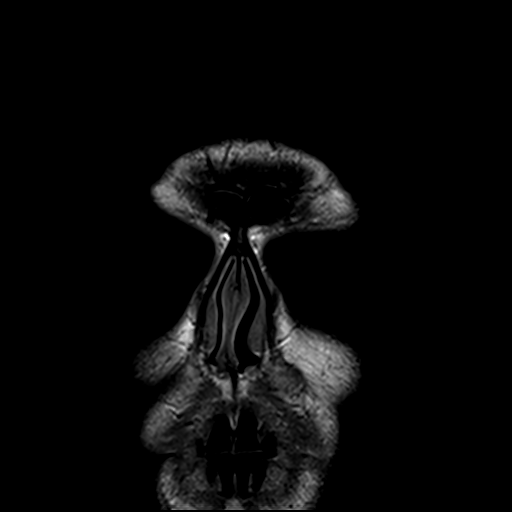
[im 14/28]
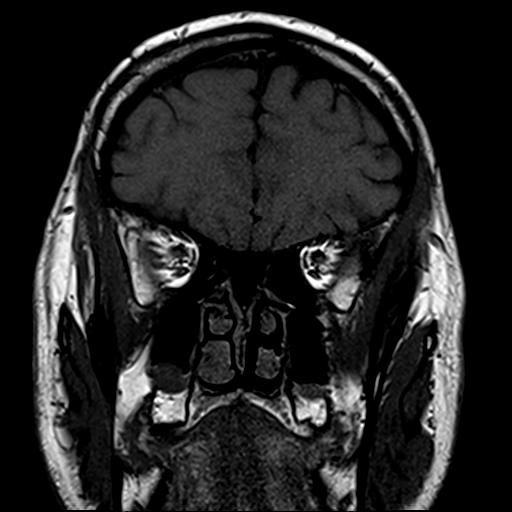
[im 28/28]
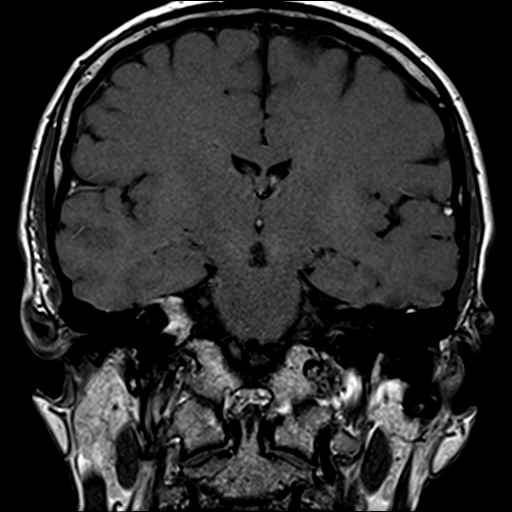

[Series 10: T1 · axial · 3.0mm · 0.35mm/px · 1 of 15 slices shown (3 of 3)]
[im 1/15]
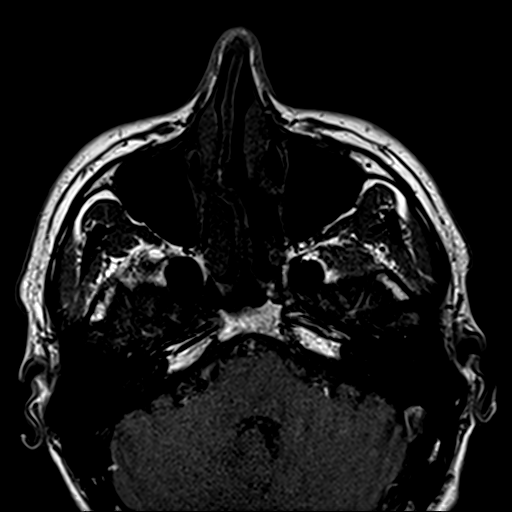

[Series 11: T2 fat-sat · coronal · 3.0mm · 0.35mm/px · 3 of 28 slices shown (1 of 2)]
[im 1/28]
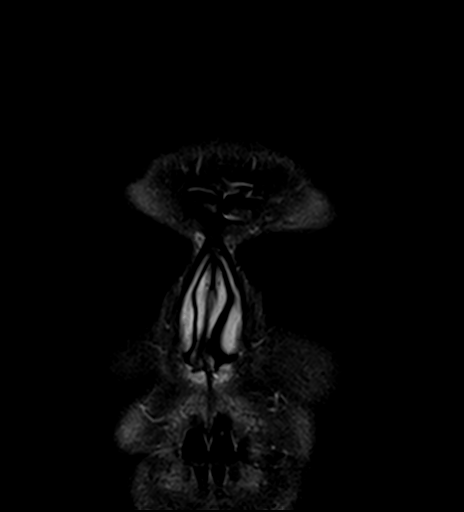
[im 14/28]
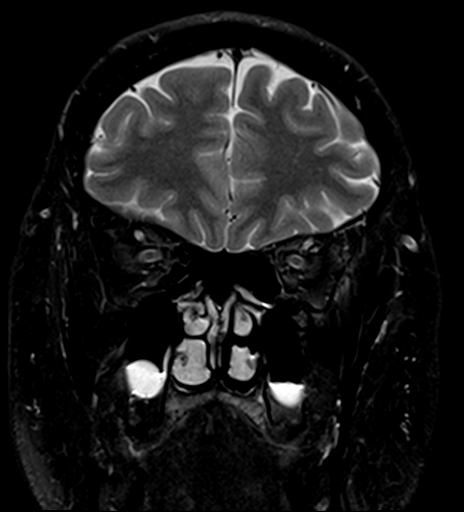
[im 28/28]
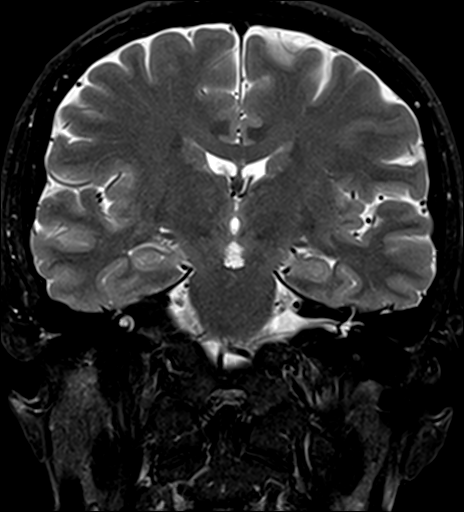

[Series 12: T2 fat-sat · axial · 3.0mm · 0.35mm/px · 1 of 15 slices shown (2 of 2)]
[im 1/15]
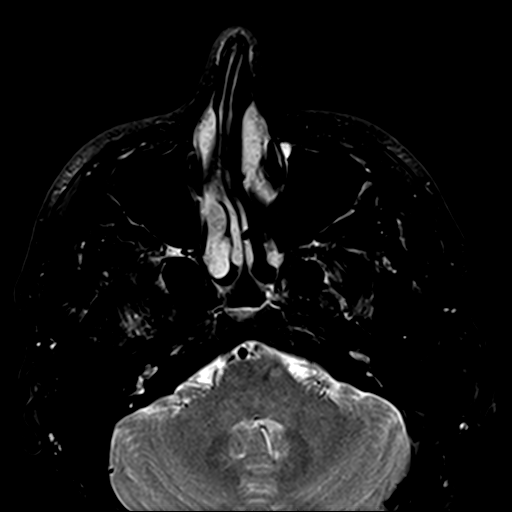

[Series 13: T2 · coronal · 5.0mm · 0.45mm/px · 3 of 28 slices shown (2 of 2)]
[im 1/28]
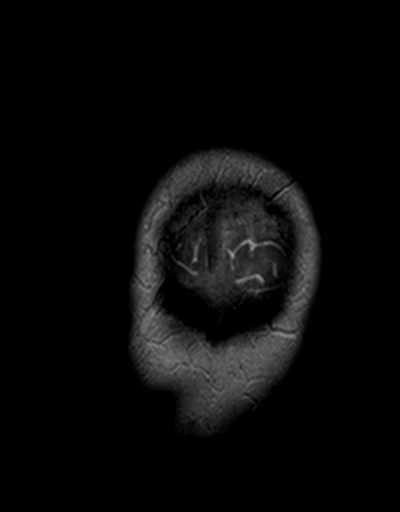
[im 14/28]
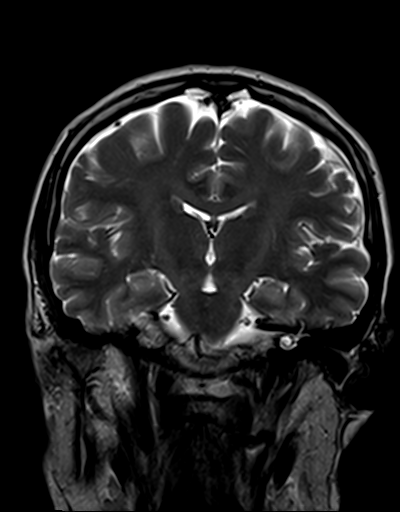
[im 28/28]
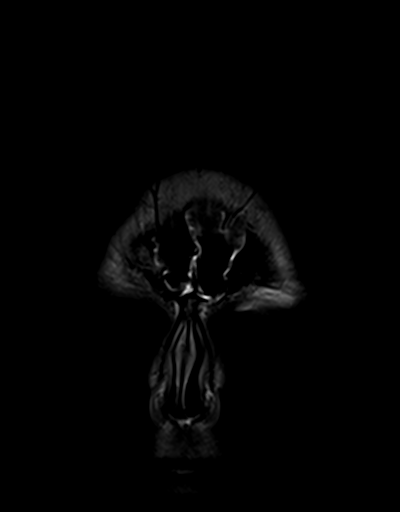

[Series 15: T1 post-contrast · coronal · 5.0mm · 0.45mm/px · 3 of 28 slices shown]
[im 1/28]
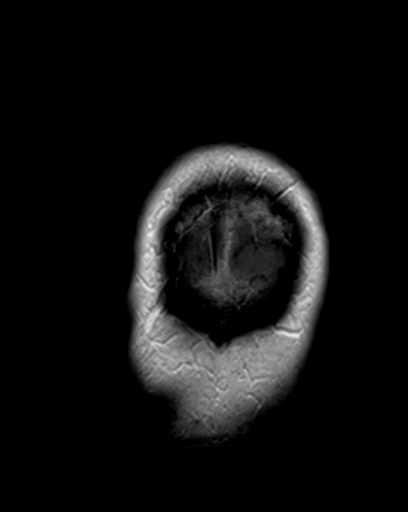
[im 14/28]
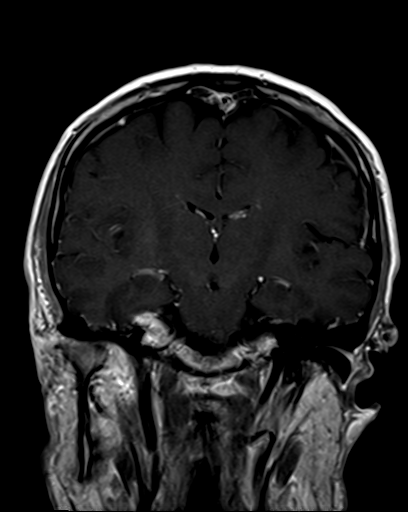
[im 28/28]
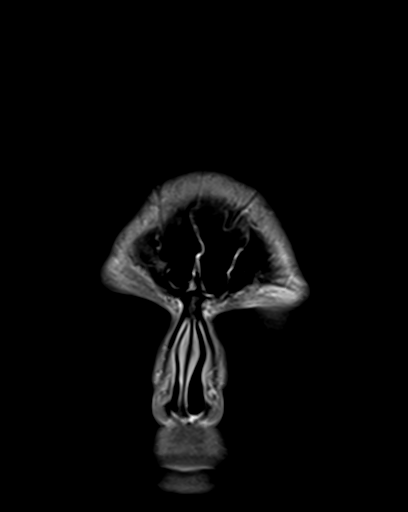

[27 of 48 positions shown; findings below may reference images not displayed]

FINDINGS: MRI HEAD FINDINGS

No evidence for acute infarction, hemorrhage, mass lesion,
hydrocephalus, or extra-axial fluid. Normal cerebral volume. No
white matter disease.

Flow voids are maintained throughout the carotid, basilar, and
vertebral arteries. There are no areas of chronic hemorrhage.

Pituitary, pineal, and cerebellar tonsils unremarkable. No upper
cervical lesions.

Post infusion, no abnormal enhancement of brain or meninges.

Extracranial soft tissues unremarkable.

MRI ORBITS FINDINGS

Globes are symmetric. Normal sized optic nerves. Normal orbital
musculature. Intracranial optic pathways appear normal including
chiasm and proximal optic nerves. No cavernous sinus mass. No
abnormal enhancement within the orbit. No significant proptosis or
enophthalmos. No evidence for prior orbital trauma such as blowout
injury. No adjacent paranasal sinus disease of significance.
IMPRESSION: Negative MR brain and MR orbits. No central cause of strabismus is
seen.

## 2016-10-04 ENCOUNTER — Encounter: Payer: Self-pay | Admitting: Gastroenterology

## 2016-10-04 ENCOUNTER — Ambulatory Visit (INDEPENDENT_AMBULATORY_CARE_PROVIDER_SITE_OTHER): Payer: Managed Care, Other (non HMO) | Admitting: Gastroenterology

## 2016-10-04 VITALS — BP 120/78 | HR 72 | Ht 72.0 in | Wt 182.1 lb

## 2016-10-04 DIAGNOSIS — K5150 Left sided colitis without complications: Secondary | ICD-10-CM | POA: Diagnosis not present

## 2016-10-04 MED ORDER — MESALAMINE 1.2 G PO TBEC: DELAYED_RELEASE_TABLET | ORAL | 11 refills | Status: DC

## 2016-10-04 MED ORDER — NA SULFATE-K SULFATE-MG SULF 17.5-3.13-1.6 GM/177ML PO SOLN: 1.0000 | Freq: Once | ORAL | 0 refills | Status: AC

## 2016-10-04 NOTE — Patient Instructions (Signed)
We have sent the following medications to your pharmacy for you to pick up at your convenience: [place].   You have been scheduled for a colonoscopy. Please follow written instructions given to you at your visit today.  Please pick up your prep supplies at the pharmacy within the next 1-3 days. If you use inhalers (even only as needed), please bring them with you on the day of your procedure. Your physician has requested that you go to www.startemmi.com and enter the access code given to you at your visit today. This web site gives a general overview about your procedure. However, you should still follow specific instructions given to you by our office regarding your preparation for the procedure.  Thank you for choosing me and [facility].  Pricilla Riffle. Dagoberto Ligas., MD., Marval Regal

## 2016-10-04 NOTE — Progress Notes (Signed)
    History of Present Illness: This is a 51 year old male with left-sided ulcerative colitis. He has not been compliant with recommended follow-up and medication usage. His last office visit was September 2016. He did not return for recommended colonoscopy in February 2017. He states he came off medications for several months and really did not note any change. He recently started Lialda at half the dose recommended in states he has been doing well. He notes certain foods leading to loose bowel movements but when he avoids these foods he has no ongoing gastrointestinal complaints. Denies weight loss, abdominal pain, constipation, change in stool caliber, melena, hematochezia, nausea, vomiting, dysphagia, reflux symptoms, chest pain.   Current Medications, Allergies, Past Medical History, Past Surgical History, Family History and Social History were reviewed in [employer] record.  Physical Exam: General: Well developed, well nourished, no acute distress Head: Normocephalic and atraumatic Eyes:  sclerae anicteric, EOMI Ears: Normal auditory acuity Mouth: No deformity or lesions Lungs: Clear throughout to auscultation Heart: Regular rate and rhythm; no murmurs, rubs or bruits Abdomen: Soft, non tender and non distended. No masses, hepatosplenomegaly or hernias noted. Normal Bowel sounds Rectal: deferred to colonoscopy Musculoskeletal: Symmetrical with no gross deformities  Pulses:  Normal pulses noted Extremities: No clubbing, cyanosis, edema or deformities noted Neurological: Alert oriented x 4, grossly nonfocal Psychological:  Alert and cooperative. Normal mood and affect  Assessment and Recommendations:  1. Ulcerative colitis, left sided. He has not been compliant with recommended medications, follow up and colonoscopies. We discussed appropriate, important regular follow-up and medication usage. He seems to understand and is interested in maintaining the best care  possible. Refill Lialda 2.4 g daily. Avoid foods that trigger diarrhea. Schedule colonoscopy. The risks (including bleeding, perforation, infection, missed lesions, medication reactions and possible hospitalization or surgery if complications occur), benefits, and alternatives to colonoscopy with possible biopsy and possible polypectomy were discussed with the patient and they consent to proceed.

## 2016-12-02 ENCOUNTER — Other Ambulatory Visit: Payer: Self-pay | Admitting: Family Medicine

## 2016-12-13 ENCOUNTER — Encounter: Payer: Managed Care, Other (non HMO) | Admitting: Gastroenterology

## 2017-01-19 ENCOUNTER — Encounter: Payer: Self-pay | Admitting: Gastroenterology

## 2017-01-20 ENCOUNTER — Ambulatory Visit (INDEPENDENT_AMBULATORY_CARE_PROVIDER_SITE_OTHER): Payer: Managed Care, Other (non HMO) | Admitting: Internal Medicine

## 2017-01-20 ENCOUNTER — Encounter: Payer: Self-pay | Admitting: Internal Medicine

## 2017-01-20 VITALS — BP 112/80 | HR 80 | Temp 98.1°F | Wt 184.6 lb

## 2017-01-20 DIAGNOSIS — R220 Localized swelling, mass and lump, head: Secondary | ICD-10-CM | POA: Diagnosis not present

## 2017-01-20 DIAGNOSIS — L729 Follicular cyst of the skin and subcutaneous tissue, unspecified: Secondary | ICD-10-CM | POA: Diagnosis not present

## 2017-01-20 NOTE — Patient Instructions (Addendum)
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  That  Is a benign process  But could get bigger.   And will talk with Dr .  Yong Channel .   About next step .  Usually observation .   And poss removal   Or get imaging .  To decide    .       I

## 2017-01-20 NOTE — Progress Notes (Signed)
Chief Complaint  Patient presents with  . Acute Visit    HPI: Tanner White 51 y.o.  SDA  PCP NA  Today  noticed a few days ago when washing his hair or feeling his scalp that there was a lump that was nontender( but not sure how long  It could have been there) . Comes in today to get it checked. Is out of town next week. He is under care for ocular myasthenia on a prednisone taper has been on it about a month No fever neurologic symptoms that are new has UC controlled.  ROS: See pertinent positives and negatives per HPI.  vitiligo UC hypothyroid and MG  Past Medical History:  Diagnosis Date  . Condyloma acuminata   . GERD (gastroesophageal reflux disease)   . Heart murmur    slight, likely since childhood  . Hypothyroidism   . MRSA infection 2009   OS; Dr Tanner White  . Rosacea     Family History  Problem Relation Age of Onset  . Hypothyroidism Mother        also ocular tumor  . Osteoarthritis Father   . Cancer Maternal Uncle        intra-abdominal  . Colon polyps Paternal Uncle   . Hypertension Maternal Grandfather   . Lung cancer Maternal Grandfather        smoker  . Thyroid disease Maternal Grandmother        hypothyroidism  . Heart disease Paternal Uncle        valvular heart disease  . Colon cancer Maternal Uncle   . Diverticulitis Paternal Grandmother   . Colon cancer Paternal Grandmother 57  . Pancreatic cancer Paternal Uncle   . Esophageal cancer Neg Hx   . Stomach cancer Neg Hx   . Rectal cancer Neg Hx   . Diabetes Neg Hx     Social History   Social History  . Marital status: Married    Spouse name: N/A  . Number of children: 1  . Years of education: N/A   Occupational History  . Engineer   .  [employer]   Social History Main Topics  . Smoking status: Never Smoker  . Smokeless tobacco: Never Used  . Alcohol use Yes     Comment: occasionally  . Drug use: No  . Sexual activity: Not Asked   Other Topics Concern  . None   Social  History Narrative   Married. 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 He still IT sales professional: bike riding- road and mountain, hiking, shoot clays    Outpatient Medications Prior to Visit  Medication Sig Dispense Refill  . levothyroxine (SYNTHROID, LEVOTHROID) 100 MCG tablet 100 mcg daily except for Tues ,Thurs& Sun take 1 1/2 (185mg) 115 tablet 3  . mesalamine (LIALDA) 1.2 g EC tablet TAKE 2 TABLETS(2.4 GRAMS) BY MOUTH DAILY WITH BREAKFAST 60 tablet 11  . Sulfacetamide-Sulfur-Sunscreen (PRASCION RA) 10-5 % CREA Apply topically daily.    .Marland Kitchenlevothyroxine (SYNTHROID, LEVOTHROID) 100 MCG tablet TAKE 1 TABLET BY MOUTH DAILY EXCEPT FOR TUESDAY AND THURSDAY TAKE 1 AND 1/2 TABLETS 115 tablet 0   No facility-administered medications prior to visit.      EXAM:  BP 112/80 (BP Location: Right Arm, Patient Position: Sitting, Cuff Size: Normal)   Pulse 80   Temp 98.1 F (36.7 C) (Oral)   Wt 184  lb 9.6 oz (83.7 kg)   BMI 25.04 kg/m   Body mass index is 25.04 kg/m.  GENERAL: vitals reviewed and listed above, alert, oriented, appears well hydrated and in no acute distress  Scalp appears clear with no changes in hair there is a 1 cm would feels mobile soft cystic lesion without a center nor scalp changes at the right vertex.here is no skin rash bumps or redness on his scalp. Skin he just has some vitiligo no acute rash Neck shows no adenopathy  pleasant and cooperative, no obvious depression or anxiety   See above that this is  Nl scalp cover  Hair   And  Not seen otherwise  ASSESSMENT AND PLAN:  Discussed the following assessment and plan:  Scalp cyst presumed  - see text.   Scalp lump Discuss  differential diagnosis I do not think this is an ominous  NOr alarming process.based on exam today  Discussed options removal /observation/ imaging.He was  Asking for  possible  imaging study that  said he would pay for it if needed.  take picture of sizing  suboptimals photo  I told him I would discuss this with his PCP about how next step to proceed  -Patient advised to return or notify health care team  if symptoms worsen ,persist or new concerns arise. Had ct of chest June and mri head and orbits in 2016 reviewed  Total visit 73mns > 50% spent counseling and coordinating care as indicated in above note and in instructions to patient .   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  That  Is a benign process  But could get bigger.   And will talk with Dr .  Tanner White.   About next step .  Usually observation .   And poss removal   Or get imaging .  To decide    .       I  WStandley Brooking Panosh M.D.

## 2017-01-21 NOTE — Progress Notes (Signed)
Dr. Regis Bill- I agree with you about observation. Since he seems to be so concerned- Roselyn Reef can you set him up for a follow up when he gets back in town and we can look at it again to see if any changes.   Garret Reddish

## 2017-01-25 ENCOUNTER — Telehealth: Payer: Self-pay

## 2017-01-25 NOTE — Telephone Encounter (Signed)
Spoke with patient and scheduled him for 02/07/17 for follow up to cyst on head

## 2017-01-25 NOTE — Telephone Encounter (Signed)
-----   Message from Marin Olp, MD sent at 01/21/2017  9:25 AM EDT -----   ----- Message ----- From: Burnis Medin, MD Sent: 01/20/2017  12:32 PM To: Marin Olp, MD  S, pt want to do more evaluation  But I   Told him  I would discuss with you .   First   Didn't think anything alarming   Feels soft to me and prob a  Benign cyst .  WP

## 2017-02-01 ENCOUNTER — Encounter: Payer: Managed Care, Other (non HMO) | Admitting: Gastroenterology

## 2017-02-07 ENCOUNTER — Encounter: Payer: Self-pay | Admitting: Family Medicine

## 2017-02-07 ENCOUNTER — Ambulatory Visit (INDEPENDENT_AMBULATORY_CARE_PROVIDER_SITE_OTHER): Payer: Managed Care, Other (non HMO) | Admitting: Family Medicine

## 2017-02-07 VITALS — BP 98/78 | HR 81 | Temp 98.1°F | Ht 72.0 in | Wt 184.2 lb

## 2017-02-07 DIAGNOSIS — L80 Vitiligo: Secondary | ICD-10-CM | POA: Insufficient documentation

## 2017-02-07 DIAGNOSIS — G7000 Myasthenia gravis without (acute) exacerbation: Secondary | ICD-10-CM | POA: Diagnosis not present

## 2017-02-07 DIAGNOSIS — L7211 Pilar cyst: Secondary | ICD-10-CM | POA: Diagnosis not present

## 2017-02-07 NOTE — Patient Instructions (Signed)
We will call you within a week or two about your referral to dermatology. If you do not hear within 3 weeks, give [place] a call.   Let us know if redness, pain, rapidly increasing in size

## 2017-02-07 NOTE — Progress Notes (Signed)
Subjective:  Tanner White is a 51 y.o. year old very pleasant male patient who presents for/with See problem oriented charting ROS- ROS-no fevers, chills, fatigue/malaise, nausea/vomiting, or recent weight change   Past Medical History-  Patient Active Problem List   Diagnosis Date Noted  . 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 Left sided ulcerative (chronic) colitis ([place]) 05/30/2012    Priority: High  . Insomnia 03/11/2015    Priority: Medium  . 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 Hypothyroidism 03/27/2007    Priority: Medium  . Vitiligo 02/07/2017    Priority: Low  . Rosacea 07/21/2010    Priority: Low  . Tinnitus 04/08/2009    Priority: Low    Medications- reviewed and updated Current Outpatient Prescriptions  Medication Sig Dispense Refill  . levothyroxine (SYNTHROID, LEVOTHROID) 100 MCG tablet 100 mcg daily except for Tues ,Thurs& Sun take 1 1/2 (174mg) 115 tablet 3  . mesalamine (LIALDA) 1.2 g EC tablet TAKE 2 TABLETS(2.4 GRAMS) BY MOUTH DAILY WITH BREAKFAST 60 tablet 11  . predniSONE (DELTASONE) 20 MG tablet     . Sulfacetamide-Sulfur-Sunscreen (PRASCION RA) 10-5 % CREA Apply topically daily.     No current facility-administered medications for this visit.     Objective: BP 98/78 (BP Location: Left Arm, Patient Position: Sitting, Cuff Size: Large)   Pulse 81   Temp 98.1 F (36.7 C) (Oral)   Ht 6' (1.829 m)   Wt 184 lb 3.2 oz (83.6 kg)   SpO2 95%   BMI 24.98 kg/m  Gen: NAD, resting comfortably CV: RRR no murmurs rubs or gallops Lungs: CTAB no crackles, wheeze, rhonchi Ext: no edema Skin: warm, dry, right side of scalp there is a slightly mobile 2.5 cm raised lesion- rubbery texture   Assessment/Plan:  Pilar cyst - Plan: Ambulatory referral to Dermatology S:  cyst about 2.5 cm in top of right scalp near occiput. Not growing since noted it. Saw  Dr. PRegis Billa few weeks ago for this and plan at that time was observation with PCP follow up.  A/P: likely pilar cyst. Could be lipoma. History of autoimmune issues and some atypical presentations for medical issues- he would prefer to see dermatology to consider excision. He is also concerned because he has read about some cases of hair loss over similar cysts.   [place] S: ocular myasthenia now formally diagnosed with Dr. YAnnamaria Boots now on prednisone with 4 month plan. He has noted drastic improvement A/P: continue follow up with pediatric optho, Dr. YAnnamaria Boots Grateful for his thorough investigation. Patient has also had to see [facility]tho [facility]   Future Appointments Date Time Provider [place] 02/27/2017 2:00 PM [facility] PREVISIT RM50 [facility]  03/20/2017 9:00 AM SLadene Artist MD [facility]    Orders Placed This Encounter  Procedures  . Ambulatory referral to Dermatology    Referral Priority:   Routine    Referral Type:   Consultation    Referral Reason:   Specialty Services Required    Requested Specialty:   Dermatology    Number of Visits Requested:   1   Return precautions advised.  SGarret Reddish MD

## 2017-02-07 NOTE — Assessment & Plan Note (Signed)
S: ocular myasthenia now formally diagnosed with Dr. Annamaria Boots- now on prednisone with 4 month plan. He has noted drastic improvement A/P: continue follow up with pediatric optho, Dr. Annamaria Boots. Grateful for his thorough investigation. Patient has also had to see neurooptho at [facility]

## 2017-03-06 ENCOUNTER — Other Ambulatory Visit: Payer: Self-pay | Admitting: Family Medicine

## 2017-03-07 ENCOUNTER — Ambulatory Visit (AMBULATORY_SURGERY_CENTER): Payer: Self-pay

## 2017-03-07 ENCOUNTER — Encounter: Payer: Self-pay | Admitting: Gastroenterology

## 2017-03-07 VITALS — Ht 72.0 in

## 2017-03-07 DIAGNOSIS — K5150 Left sided colitis without complications: Secondary | ICD-10-CM

## 2017-03-07 MED ORDER — SUPREP BOWEL PREP KIT 17.5-3.13-1.6 GM/177ML PO SOLN: 1.0000 | Freq: Once | ORAL | 0 refills | Status: AC

## 2017-03-07 NOTE — Progress Notes (Signed)
No allergies to eggs or soy No diet meds No home oxygen No past problems with anesthesia  Declined emmi

## 2017-03-20 ENCOUNTER — Encounter: Payer: Self-pay | Admitting: Gastroenterology

## 2017-03-20 ENCOUNTER — Ambulatory Visit (AMBULATORY_SURGERY_CENTER): Payer: Managed Care, Other (non HMO) | Admitting: Gastroenterology

## 2017-03-20 VITALS — BP 118/80 | HR 57 | Temp 97.3°F | Resp 9 | Ht 72.0 in | Wt 184.0 lb

## 2017-03-20 DIAGNOSIS — K635 Polyp of colon: Secondary | ICD-10-CM

## 2017-03-20 DIAGNOSIS — D125 Benign neoplasm of sigmoid colon: Secondary | ICD-10-CM | POA: Diagnosis not present

## 2017-03-20 DIAGNOSIS — K5180 Other ulcerative colitis without complications: Secondary | ICD-10-CM

## 2017-03-20 MED ORDER — SODIUM CHLORIDE 0.9 % IV SOLN: 500.0000 mL | INTRAVENOUS | Status: DC

## 2017-03-20 NOTE — Op Note (Signed)
[facility] Patient Name: Tanner White Procedure Date: 03/20/2017 8:35 AM MRN: [id number] Endoscopist: Ladene Artist , MD Age: 51 Referring MD:  Date of Birth: 1965-06-12 Gender: Male Account #: [id number] Procedure:                Colonoscopy Indications:              High risk colon cancer surveillance: Ulcerative                            left sided colitis Medicines:                Monitored Anesthesia Care Procedure:                Pre-Anesthesia Assessment:                           - Prior to the procedure, a History and Physical                            was performed, and patient medications and                            allergies were reviewed. The patient's tolerance of                            previous anesthesia was also reviewed. The risks                            and benefits of the procedure and the sedation                            options and risks were discussed with the patient.                            All questions were answered, and informed consent                            was obtained. Prior Anticoagulants: The patient has                            taken no previous anticoagulant or antiplatelet                            agents. ASA Grade Assessment: II - A patient with                            mild systemic disease. After reviewing the risks                            and benefits, the patient was deemed in                            satisfactory condition to undergo the procedure.  After obtaining informed consent, the colonoscope                            was passed under direct vision. Throughout the                            procedure, the patient's blood pressure, pulse, and                            oxygen saturations were monitored continuously. The                            Model CF-HQ190L [id number]) scope was introduced                            through the anus and advanced to the the  cecum,                            identified by appendiceal orifice and ileocecal                            valve. The ileocecal valve, appendiceal orifice,                            and rectum were photographed. The quality of the                            bowel preparation was excellent. The colonoscopy                            was performed without difficulty. The patient                            tolerated the procedure well. Scope In: 8:43:43 AM Scope Out: 8:57:16 AM Scope Withdrawal Time: 0 hours 12 minutes 12 seconds  Total Procedure Duration: 0 hours 13 minutes 33 seconds  Findings:                 The perianal and digital rectal examinations were                            normal.                           A 6 mm polyp was found in the sigmoid colon. The                            polyp was sessile. The polyp was removed with a                            cold snare. Resection and retrieval were complete.                           The exam was otherwise without abnormality on  direct and retroflexion views. Random biopsies                            obtained throughout. Complications:            No immediate complications. Estimated blood loss:                            None. Estimated Blood Loss:     Estimated blood loss: none. Impression:               - One 6 mm polyp in the sigmoid colon, removed with                            a cold snare. Resected and retrieved.                           - The examination was otherwise normal on direct                            and retroflexion views. Biopsies obtainned. Recommendation:           - Repeat colonoscopy in 3 years for surveillance.                           - Patient has a contact number available for                            emergencies. The signs and symptoms of potential                            delayed complications were discussed with the                            patient. Return  to normal activities tomorrow.                            Written discharge instructions were provided to the                            patient.                           - Resume previous diet.                           - Continue present medications.                           - Await pathology results. Ladene Artist, MD 03/20/2017 9:01:27 AM This report has been signed electronically.

## 2017-03-20 NOTE — Progress Notes (Signed)
Pt's states no medical or surgical changes since previsit or office visit. 

## 2017-03-20 NOTE — Progress Notes (Signed)
Report given to PACU, vss 

## 2017-03-20 NOTE — Progress Notes (Signed)
Called to room to assist during endoscopic procedure.  Patient ID and intended procedure confirmed with present staff. Received instructions for my participation in the procedure from the performing physician.  

## 2017-03-20 NOTE — Patient Instructions (Signed)
**   Handout given on polyps **   YOU HAD AN ENDOSCOPIC PROCEDURE TODAY AT [facility]:   Refer to the procedure report that was given to you for any specific questions about what was found during the examination.  If the procedure report does not answer your questions, please call your gastroenterologist to clarify.  If you requested that your care partner not be given the details of your procedure findings, then the procedure report has been included in a sealed envelope for you to review at your convenience later.  YOU SHOULD EXPECT: Some feelings of bloating in the abdomen. Passage of more gas than usual.  Walking can help get rid of the air that was put into your GI tract during the procedure and reduce the bloating. If you had a lower endoscopy (such as a colonoscopy or flexible sigmoidoscopy) you may notice spotting of blood in your stool or on the toilet paper. If you underwent a bowel prep for your procedure, you may not have a normal bowel movement for a few days.  Please Note:  You might notice some irritation and congestion in your nose or some drainage.  This is from the oxygen used during your procedure.  There is no need for concern and it should clear up in a day or so.  SYMPTOMS TO REPORT IMMEDIATELY:   Following lower endoscopy (colonoscopy or flexible sigmoidoscopy):  Excessive amounts of blood in the stool  Significant tenderness or worsening of abdominal pains  Swelling of the abdomen that is new, acute  Fever of 100F or higher  For urgent or emergent issues, a gastroenterologist can be reached at any hour by calling [phone].   DIET:  We do recommend a small meal at first, but then you may proceed to your regular diet.  Drink plenty of fluids but you should avoid alcoholic beverages for 24 hours.  ACTIVITY:  You should plan to take it easy for the rest of today and you should NOT DRIVE or use heavy machinery until tomorrow (because of the sedation  medicines used during the test).    FOLLOW UP: Our staff will call the number listed on your records the next business day following your procedure to check on you and address any questions or concerns that you may have regarding the information given to you following your procedure. If we do not reach you, we will leave a message.  However, if you are feeling well and you are not experiencing any problems, there is no need to return our call.  We will assume that you have returned to your regular daily activities without incident.  If any biopsies were taken you will be contacted by phone or by letter within the next 1-3 weeks.  Please call us at [phone] if you have not heard about the biopsies in 3 weeks.    SIGNATURES/CONFIDENTIALITY: You and/or your care partner have signed paperwork which will be entered into your electronic medical record.  These signatures attest to the fact that that the information above on your After Visit Summary has been reviewed and is understood.  Full responsibility of the confidentiality of this discharge information lies with you and/or your care-partner.

## 2017-03-21 ENCOUNTER — Telehealth: Payer: Self-pay | Admitting: *Deleted

## 2017-03-21 NOTE — Telephone Encounter (Signed)
  Follow up Call-  Call back number 03/20/2017  Post procedure Call Back phone  # [phone]  Permission to leave phone message Yes  Some recent data might be hidden     Patient questions:  Do you have a fever, pain , or abdominal swelling? No. Pain Score  0 *  Have you tolerated food without any problems? Yes.    Have you been able to return to your normal activities? Yes.    Do you have any questions about your discharge instructions: Diet   Yes.   Medications  No. Follow up visit  No.  Do you have questions or concerns about your Care? No.  Actions: * If pain score is 4 or above: No action needed, pain <4.

## 2017-03-24 ENCOUNTER — Telehealth: Payer: Self-pay | Admitting: Gastroenterology

## 2017-03-24 NOTE — Telephone Encounter (Signed)
Pt advised that the path has not been reviewed by Dr Fuller Plan and as soon as reviewed we will contact him.  Pt verbalized understanding

## 2017-03-28 ENCOUNTER — Encounter: Payer: Self-pay | Admitting: Gastroenterology

## 2017-09-26 ENCOUNTER — Other Ambulatory Visit: Payer: Self-pay | Admitting: Family Medicine

## 2017-09-26 NOTE — Telephone Encounter (Signed)
Pt has CPE scheduled for 11/14/17

## 2017-09-26 NOTE — Telephone Encounter (Signed)
Pt requesting refill of Levothyroxine (Synthroid) 165mg tab. Pt states he is out of the medication.    Next OV: 11/14/17 LOV:02/07/17 Last TSH: 05/19/16  Dr. HYong Channel

## 2017-09-26 NOTE — Telephone Encounter (Signed)
Copied from [place] [id number]. Topic: Quick Communication - Rx Refill/Question >> Sep 26, 2017  2:18 PM Ether Griffins B wrote: Medication: levothyroxine (SYNTHROID, LEVOTHROID) 100 MCG tablet  Pt is out of medication. He called the pharmacy last week and they state they never heard from the office.   Has the patient contacted their pharmacy? Yes.   (Agent: If no, request that the patient contact the pharmacy for the refill.) Preferred Pharmacy (with phone number or street name): [id number] - [place], [place] - [address] [place] [address] Agent: Please be advised that RX refills may take up to 3 business days. We ask that you follow-up with your pharmacy.

## 2017-09-27 MED ORDER — LEVOTHYROXINE SODIUM 100 MCG PO TABS: ORAL_TABLET | ORAL | 3 refills | Status: DC

## 2017-09-27 NOTE — Telephone Encounter (Signed)
This prescription was sent to the pharmacy this morning

## 2017-09-27 NOTE — Telephone Encounter (Signed)
See note

## 2017-11-14 ENCOUNTER — Encounter: Payer: Self-pay | Admitting: Family Medicine

## 2017-11-14 ENCOUNTER — Ambulatory Visit (INDEPENDENT_AMBULATORY_CARE_PROVIDER_SITE_OTHER): Payer: Managed Care, Other (non HMO) | Admitting: Family Medicine

## 2017-11-14 VITALS — BP 112/84 | HR 77 | Temp 98.1°F | Ht 72.0 in | Wt 183.0 lb

## 2017-11-14 DIAGNOSIS — Z0000 Encounter for general adult medical examination without abnormal findings: Secondary | ICD-10-CM

## 2017-11-14 DIAGNOSIS — Z125 Encounter for screening for malignant neoplasm of prostate: Secondary | ICD-10-CM | POA: Diagnosis not present

## 2017-11-14 DIAGNOSIS — E039 Hypothyroidism, unspecified: Secondary | ICD-10-CM | POA: Diagnosis not present

## 2017-11-14 DIAGNOSIS — E049 Nontoxic goiter, unspecified: Secondary | ICD-10-CM

## 2017-11-14 DIAGNOSIS — G7000 Myasthenia gravis without (acute) exacerbation: Secondary | ICD-10-CM

## 2017-11-14 DIAGNOSIS — K5150 Left sided colitis without complications: Secondary | ICD-10-CM

## 2017-11-14 DIAGNOSIS — E781 Pure hyperglyceridemia: Secondary | ICD-10-CM

## 2017-11-14 MED ORDER — LEVOTHYROXINE SODIUM 100 MCG PO TABS: ORAL_TABLET | ORAL | 3 refills | Status: DC

## 2017-11-14 NOTE — Assessment & Plan Note (Signed)
S:11/07/12 FNA  Left thyroid about 2x 2 x 1 cm. Has been on levothyroxine 100 mcg for several years (does take 1.5 on tues/thurs/sunday as well) "Notes Recorded by Hendricks Limes, MD on 11/08/2012 at 5:50 PM NON cancerous goiter on biopsy. TSH should be approximately 1.00 to prevent progression.Appropriate would be repeat ultrasound as clinically indicated by annual monitor with manual examination & full thyroid function tests. Hopp" Lab Results  Component Value Date   TSH 1.96 05/19/2016  A/P:stable thyroid exam today- no clear growth on CPE today. TSH near 2 in past. Discussed option of increasing dose in past- he prefers to stay on same dose due to stability as long as in normal range. Will update TSH

## 2017-11-14 NOTE — Assessment & Plan Note (Signed)
Ulcerative colitis- Has been off mesalamine while on prednisone. He plans to reach out to GI to update them

## 2017-11-14 NOTE — Assessment & Plan Note (Signed)
[facility] now and is on prednisone alone

## 2017-11-14 NOTE — Progress Notes (Signed)
Phone: [phone]  Subjective:  Patient presents today for their annual physical. Chief complaint-noted.   See problem oriented charting- ROS- full  review of systems was completed and negative except for: unchanged tinnitus, some change in color in hands and feet, anxiety, seasonal allergies. Nonexertional chest pain noted.   The following were reviewed and entered/updated in epic: Past Medical History:  Diagnosis Date  . Condyloma acuminata   . GERD (gastroesophageal reflux disease)   . Heart murmur    slight, likely since childhood  . Hypothyroidism   . MRSA infection 2009   OS; Dr Lucita Ferrara  . Rosacea    Patient Active Problem List   Diagnosis Date Noted  . 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 Left sided ulcerative (chronic) colitis ([place]) 05/30/2012    Priority: High  . Insomnia 03/11/2015    Priority: Medium  . 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 Hypothyroidism 03/27/2007    Priority: Medium  . Vitiligo 02/07/2017    Priority: Low  . Rosacea 07/21/2010    Priority: Low  . Tinnitus 04/08/2009    Priority: Low   Past Surgical History:  Procedure Laterality Date  . BIOPSY THYROID  11/07/12   FNA: goiter  . COLONOSCOPY  2002 , 2008, 2014   colitis; Dr Fuller Plan  . LASIK Bilateral 2001  . TONSILLECTOMY    . WISDOM TOOTH EXTRACTION      Family History  Problem Relation Age of Onset  . Hypothyroidism Mother        also ocular tumor  . Osteoarthritis Father   . Cancer Maternal Uncle        intra-abdominal  . Colon polyps Paternal Uncle   . Hypertension Maternal Grandfather   . Lung cancer Maternal Grandfather        smoker  . Thyroid disease Maternal Grandmother        hypothyroidism  . Heart disease Paternal Uncle        valvular heart disease  . Colon cancer Maternal Uncle   . Diverticulitis Paternal Grandmother   . Colon cancer Paternal  Grandmother 40  . Pancreatic cancer Paternal Uncle   . Esophageal cancer Neg Hx   . Stomach cancer Neg Hx   . Rectal cancer Neg Hx   . Diabetes Neg Hx     Medications- reviewed and updated Current Outpatient Medications  Medication Sig Dispense Refill  . predniSONE (DELTASONE) 10 MG tablet Take 10 mg by mouth at bedtime.     Marland Kitchen levothyroxine (SYNTHROID, LEVOTHROID) 100 MCG tablet 100 mcg daily except for Tues ,Thurs& Sun take 1 1/2 (132mg) 115 tablet 3  . mesalamine (LIALDA) 1.2 g EC tablet TAKE 2 TABLETS(2.4 GRAMS) BY MOUTH DAILY WITH BREAKFAST 60 tablet 11  . Sulfacetamide-Sulfur-Sunscreen (PRASCION RA) 10-5 % CREA Apply topically daily.     No current facility-administered medications for this visit.     Allergies-reviewed and updated Allergies  Allergen Reactions  . Doxycycline     hives    Social History   Social History Narrative   Married. 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 Oropharynx normal Neck: no thyromegaly CV: RRR no murmurs rubs or gallops Lungs: CTAB no crackles, wheeze, rhonchi Abdomen: soft/nontender/nondistended/normal bowel sounds. No rebound or guarding.  Ext: no edema Skin: warm, dry Neuro: grossly normal, moves all extremities, PERRLA Rectal: normal tone, normal sized prostate, no masses or tenderness  Assessment/Plan:  52 y.o. male presenting for annual physical.  Health Maintenance counseling: 1. Anticipatory guidance: Patient counseled regarding regular dental exams -q6 months, eye exams - about  yearly- has corneal scar. Also seeing neurology with ocular myasthenia issues and history diplopia from this- improved on prednisone in past, wearing seatbelts.  2. Risk factor reduction:  Advised patient of need for regular exercise and diet rich and fruits and vegetables to reduce risk of heart attack and stroke. Exercise- tough right now due to very tough work schedule, able to ride bike some but not like he wants to. Diet- weight largely stable from last CPE in early 2018. Reasonably healthy diet- working to cut down on sugar Wt Readings from Last 3 Encounters:  11/14/17 183 lb (83 kg)  03/20/17 184 lb (83.5 kg)  02/07/17 184 lb 3.2 oz (83.6 kg)  3. Immunizations/screenings/ancillary studies- discussed Tdap today - he wants to come back for nurse visit Immunization History  Administered Date(s) Administered  . Influenza Whole 03/27/2007, 04/08/2009  . Influenza,inj,Quad PF,6+ Mos 03/13/2013, 05/26/2016  . PPD Test 03/28/2016  . Td 05/17/2007  4. Prostate cancer screening-  wants yearly PSA due to family history. Low risk rectal exam and prior PSA trend Lab Results  Component Value Date   PSA 0.59 05/19/2016   PSA 0.54 08/11/2011   PSA 0.35 12/21/2009   5. Colon cancer screening -  follows with GI due to ulcerative colitis history. Last colonosopy 03/2017 with 3 year repeat 6. Skin cancer screening- sees Dr. 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 advised regular sunscreen use. Denies worrisome, changing, or new skin lesions.  7. Never smoker  Status of chronic or acute concerns   Right behind breast plate every so often will get some pressure for up to 10-15 minutes. No other symptoms along wit it- no SOB, dizziness, sweating, left arm or neck pain. Usually happens at rest. Cant pin it to relation to eating pattern. Hasnt tried tums/rolaids- encouraged him to try this. Has never happened with riding his bike.   Hyperglycemia and hypertriglyceridemia- was new in  2018- has  not made significant improvements in weight  Low testosterone - low normal range in past- we did not opt to retest today  High level of stress with his job-  Considered seeing behavioral health - has not followed through with this- admits to high anxiety.   Non-toxic nodular goiter  Also some vitiligo within last year  Non-toxic nodular goiter S:11/07/12 FNA  Left thyroid about 2x 2 x 1 cm. Has been on levothyroxine 100 mcg for several years (does take 1.5 on tues/thurs/sunday as well) "Notes Recorded by Hendricks Limes, MD on 11/08/2012 at 5:50 PM NON cancerous goiter on biopsy. TSH should be approximately 1.00 to prevent progression.Appropriate would be repeat ultrasound as clinically indicated by annual monitor with manual examination & full thyroid function tests. Hopp" Lab Results  Component Value Date   TSH  1.96 05/19/2016  A/P:stable thyroid exam today- no clear growth on CPE today. TSH near 2 in past. Discussed option of increasing dose in past- he prefers to stay on same dose due to stability as long as in normal range. Will update TSH  Hypothyroidism Remains on levothyroxine 121mg with 1569m 3 days a week. Stable nodule today on left at 2 x 1 cm.   Left sided ulcerative (chronic) colitis ([facility]) Ulcerative colitis- Has been off mesalamine while on prednisone. He plans to reach out to GI to update them  [facility]ia [facility] and is on [facility] alone   1 year CPE reasonable  Lab/Order associations: Preventative health care  Hypothyroidism, unspecified type - Plan: CBC, Comprehensive metabolic panel, Lipid panel, TSH  Screening for prostate cancer - Plan: PSA  Hypertriglyceridemia - Plan: CBC, Comprehensive metabolic panel, Lipid panel, TSH  Non-toxic nodular goiter  Left sided ulcerative (chronic) colitis ([facility] Ocular myasthenia gravis ([facility] Meds ordered this encounter  Medications  . levothyroxine (SYNTHROID,  LEVOTHROID) 100 MCG tablet    Sig: 100 mcg daily except for Tues ,Thurs& Sun take 1 1/2 (15066m    Dispense:  115 tablet    Refill:  3    Return precautions advised.  SteGarret ReddishD

## 2017-11-14 NOTE — Patient Instructions (Addendum)
Health Maintenance Due  Topic Date Due  . TETANUS/TDAP - Patient would like to wait to get the vaccine. You can call to schedule a nurse visit when you are ready- fine to wait until after next neurology visit 05/16/2017   No changes today unless labs lead [place] to adjustments.   Thyroid nodule feels stable.   Please stop by the lab before you go.

## 2017-11-14 NOTE — Assessment & Plan Note (Signed)
Remains on levothyroxine 160mg with 1543m 3 days a week. Stable nodule today on left at 2 x 1 cm.

## 2017-11-15 ENCOUNTER — Encounter: Payer: Self-pay | Admitting: Family Medicine

## 2017-11-15 LAB — CBC
HCT: 43.2 % (ref 39.0–52.0)
Hemoglobin: 14.8 g/dL (ref 13.0–17.0)
MCHC: 34.2 g/dL (ref 30.0–36.0)
MCV: 90.6 fl (ref 78.0–100.0)
PLATELETS: 189 10*3/uL (ref 150.0–400.0)
RBC: 4.77 Mil/uL (ref 4.22–5.81)
RDW: 13.2 % (ref 11.5–15.5)
WBC: 6.6 10*3/uL (ref 4.0–10.5)

## 2017-11-15 LAB — COMPREHENSIVE METABOLIC PANEL
ALBUMIN: 4.3 g/dL (ref 3.5–5.2)
ALK PHOS: 41 U/L (ref 39–117)
ALT: 12 U/L (ref 0–53)
AST: 14 U/L (ref 0–37)
BILIRUBIN TOTAL: 1.1 mg/dL (ref 0.2–1.2)
BUN: 17 mg/dL (ref 6–23)
CALCIUM: 9.4 mg/dL (ref 8.4–10.5)
CHLORIDE: 102 meq/L (ref 96–112)
CO2: 27 mEq/L (ref 19–32)
Creatinine, Ser: 1 mg/dL (ref 0.40–1.50)
GFR: 84.1 mL/min (ref >60.00)
Glucose, Bld: 79 mg/dL (ref 70–99)
Potassium: 3.7 mEq/L (ref 3.5–5.1)
Sodium: 140 mEq/L (ref 135–145)
TOTAL PROTEIN: 6.7 g/dL (ref 6.0–8.3)

## 2017-11-15 LAB — TSH: TSH: 0.25 u[IU]/mL — ABNORMAL LOW (ref 0.35–4.50)

## 2017-11-15 LAB — PSA: PSA: 0.52 ng/mL (ref 0.10–4.00)

## 2017-11-15 LAB — LIPID PANEL
CHOLESTEROL: 184 mg/dL (ref 0–200)
HDL: 58 mg/dL (ref >39.00)
LDL Cholesterol: 92 mg/dL (ref 0–99)
NonHDL: 125.83
TRIGLYCERIDES: 170 mg/dL — AB (ref 0.0–149.0)
Total CHOL/HDL Ratio: 3
VLDL: 34 mg/dL (ref 0.0–40.0)

## 2017-11-15 NOTE — Progress Notes (Signed)
Your thyroid was slightly overtreated. I know you take 129mg three times a week. Please cut that to twice a week and then team set him up with 6 week repeat TSH under hypothyroidism  Your CBC was normal (blood counts, infection fighting cells, platelets). Your CMET was normal (kidney, liver, and electrolytes, blood sugar).  Your cholesterol looks great- improved from last year.  PSA trend low risk for prostate cancer.

## 2018-06-18 ENCOUNTER — Telehealth: Payer: Self-pay | Admitting: Family Medicine

## 2018-06-18 ENCOUNTER — Other Ambulatory Visit: Payer: Self-pay

## 2018-06-18 DIAGNOSIS — E039 Hypothyroidism, unspecified: Secondary | ICD-10-CM

## 2018-06-18 NOTE — Telephone Encounter (Signed)
Order entered. I left a voicemail message asking patient to call and schedule an lab appointment and schedule his physical for July.

## 2018-06-18 NOTE — Telephone Encounter (Signed)
See note  Copied from [place] [id number]. Topic: Appointment Scheduling - Scheduling Inquiry for Clinic >> Jun 18, 2018  8:48 AM Lennox Solders wrote: Reason for CRM:pt was seen in July 2019 and was suppose to come back in 6 wks to have tsh recheck. Please put order in system

## 2018-06-18 NOTE — Telephone Encounter (Signed)
Can just do labs-make sure you get scheduled for physical in July though please-1 year out from last physical

## 2018-07-03 ENCOUNTER — Other Ambulatory Visit (INDEPENDENT_AMBULATORY_CARE_PROVIDER_SITE_OTHER): Payer: Managed Care, Other (non HMO)

## 2018-07-03 DIAGNOSIS — E039 Hypothyroidism, unspecified: Secondary | ICD-10-CM

## 2018-07-03 LAB — TSH: TSH: 0.38 u[IU]/mL (ref 0.35–4.50)

## 2018-07-23 ENCOUNTER — Encounter: Payer: Self-pay | Admitting: Physician Assistant

## 2018-07-23 ENCOUNTER — Ambulatory Visit: Payer: Managed Care, Other (non HMO) | Admitting: Physician Assistant

## 2018-07-23 VITALS — BP 130/82 | HR 88 | Temp 99.0°F | Ht 72.0 in | Wt 186.0 lb

## 2018-07-23 DIAGNOSIS — R6889 Other general symptoms and signs: Secondary | ICD-10-CM | POA: Diagnosis not present

## 2018-07-23 LAB — POC INFLUENZA A&B (BINAX/QUICKVUE)
INFLUENZA B, POC: NEGATIVE
Influenza A, POC: NEGATIVE

## 2018-07-23 NOTE — Progress Notes (Signed)
Tanner White is a 53 y.o. male here for a new problem.  I acted as a Education administrator for [employer], PA-C Anselmo Pickler, LPN  History of Present Illness:   Chief Complaint  Patient presents with  . Cough    Cough  This is a new problem. Episode onset: Started on Saturday. The problem has been gradually improving. The problem occurs constantly. The cough is productive of sputum (expectorating thick yellow). Associated symptoms include chills, a fever (100-101 over the weekend), postnasal drip and a sore throat. Pertinent negatives include no ear congestion, ear pain, headaches or shortness of breath. Associated symptoms comments: Chest congestion, body aches. Risk factors for lung disease include occupational exposure (was in [place] last week for work and people with Dx with Flu A). Treatments tried: Tylenol and advil. The treatment provided mild relief. His past medical history is significant for pneumonia. There is no history of asthma or bronchitis.      Past Medical History:  Diagnosis Date  . Condyloma acuminata   . GERD (gastroesophageal reflux disease)   . Heart murmur    slight, likely since childhood  . Hypothyroidism   . MRSA infection 2009   OS; Dr Lucita Ferrara  . Rosacea      Social History   Socioeconomic History  . Marital status: Married    Spouse name: Not on file  . Number of children: 1  . Years of education: Not on file  . Highest education level: Not on file  Occupational History  . Occupation: Lobbyist: [employer]  . Financial resource strain: Not on file  . Food insecurity:    Worry: Not on file    Inability: Not on file  . Transportation needs:    Medical: Not on file    Non-medical: Not on file  Tobacco Use  . Smoking status: Never Smoker  . Smokeless tobacco: Never Used  Substance and Sexual Activity  . Alcohol use: Yes    Comment: occasionally; 3-4 combination of all 3 listed above  . Drug use: No  . Sexual  activity: Not on file  Lifestyle  . Physical activity:    Days per week: Not on file    Minutes per session: Not on file  . Stress: Not on file  Relationships  . Social connections:    Talks on phone: Not on file    Gets together: Not on file    Attends religious service: Not on file    Active member of club or organization: Not on file    Attends meetings of clubs or organizations: Not on file    Relationship status: Not on file  . Intimate partner violence:    Fear of current or ex partner: Not on file    Emotionally abused: Not on file    Physically abused: Not on file    Forced sexual activity: Not on file  Other Topics Concern  . Not on file  Social History Narrative   Married. 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 BIOPSY THYROID  11/07/12   FNA: goiter  . COLONOSCOPY  2002 , 2008, 2014   colitis; Dr Fuller Plan  . LASIK Bilateral 2001  . TONSILLECTOMY    .  WISDOM TOOTH EXTRACTION      Family History  Problem Relation Age of Onset  . Hypothyroidism Mother        also ocular tumor  . Osteoarthritis Father   . Cancer Maternal Uncle        intra-abdominal  . Colon polyps Paternal Uncle   . Hypertension Maternal Grandfather   . Lung cancer Maternal Grandfather        smoker  . Thyroid disease Maternal Grandmother        hypothyroidism  . Heart disease Paternal Uncle        valvular heart disease  . Colon cancer Maternal Uncle   . Diverticulitis Paternal Grandmother   . Colon cancer Paternal Grandmother 36  . Pancreatic cancer Paternal Uncle   . Esophageal cancer Neg Hx   . Stomach cancer Neg Hx   . Rectal cancer Neg Hx   . Diabetes Neg Hx     Allergies  Allergen Reactions  . Doxycycline     hives    Current Medications:    Current Outpatient Medications:  .  levothyroxine (SYNTHROID, LEVOTHROID) 100 MCG tablet, 100 mcg daily except for Tues ,Thurs& Sun take 1 1/2 (137mg), Disp: 115 tablet, Rfl: 3 .  predniSONE (DELTASONE) 10 MG tablet, Take 10 mg by mouth at bedtime. , Disp: , Rfl:  .  Sulfacetamide-Sulfur-Sunscreen (PRASCION RA) 10-5 % CREA, Apply topically daily., Disp: , Rfl:  .  mesalamine (LIALDA) 1.2 g EC tablet, TAKE 2 TABLETS(2.4 GRAMS) BY MOUTH DAILY WITH BREAKFAST (Patient not taking: Reported on 07/23/2018), Disp: 60 tablet, Rfl: 11   Review of Systems:   Review of Systems  Constitutional: Positive for chills and fever (100-101 over the weekend).  HENT: Positive for postnasal drip and sore throat. Negative for ear pain.   Respiratory: Positive for cough. Negative for shortness of breath.   Neurological: Negative for headaches.    Vitals:   Vitals:   07/23/18 1303  BP: 130/82  Pulse: 88  Temp: 99 F (37.2 C)  TempSrc: Oral  SpO2: 97%  Weight: 186 lb (84.4 kg)  Height: 6' (1.829 m)     Body mass index is 25.23 kg/m.  Physical Exam:   Physical Exam Vitals signs and nursing note reviewed.  Constitutional:      General: He is not in acute distress.    Appearance: He is well-developed. He is not ill-appearing or toxic-appearing.  HENT:     Head: Normocephalic and atraumatic.     Right Ear: Tympanic membrane, ear canal and external ear normal. Tympanic membrane is not erythematous, retracted or bulging.     Left Ear: Tympanic membrane, ear canal and external ear normal. Tympanic membrane is not erythematous, retracted or bulging.     Nose: Mucosal edema, congestion and rhinorrhea present.     Right Sinus: No maxillary sinus tenderness or frontal sinus tenderness.     Left Sinus: No maxillary sinus tenderness or frontal sinus tenderness.     Mouth/Throat:     Lips: Pink.     Mouth: Mucous membranes are moist.     Pharynx: Uvula midline. Posterior oropharyngeal erythema present.      Tonsils: Swelling: 0 on the right. 0 on the left.  Eyes:     General: Lids are normal.     Conjunctiva/sclera: Conjunctivae normal.  Neck:     Trachea: Trachea normal.  Cardiovascular:     Rate and Rhythm: Normal rate and regular rhythm.     Heart sounds: Normal  heart sounds, S1 normal and S2 normal.  Pulmonary:     Effort: Pulmonary effort is normal.     Breath sounds: Normal breath sounds. No decreased breath sounds, wheezing, rhonchi or rales.  Lymphadenopathy:     Cervical: No cervical adenopathy.  Skin:    General: Skin is warm and dry.  Neurological:     Mental Status: He is alert.  Psychiatric:        Speech: Speech normal.        Behavior: Behavior normal. Behavior is cooperative.     Results for orders placed or performed in visit on 07/23/18  POC Influenza A&B(BINAX/QUICKVUE)  Result Value Ref Range   Influenza A, POC Negative Negative   Influenza B, POC Negative Negative    Assessment and Plan:   [place] was seen today for cough.  Diagnoses and all orders for this visit:  Flu-like symptoms -     POC Influenza A&B(BINAX/QUICKVUE)   No red flags on exam. Flu swab negative. Suspect viral illness. Discussed return precautions -- low threshold given immunocompromised status. Reviewed specific return precautions including worsening fever, SOB, worsening cough or other concerns. Push fluids and rest. I recommend that patient follow-up if symptoms worsen or persist despite treatment x 7-10 days, sooner if needed.   . Reviewed expectations re: course of current medical issues. . Discussed self-management of symptoms. . Outlined signs and symptoms indicating need for more acute intervention. . Patient verbalized understanding and all questions were answered. . See orders for this visit as documented in the electronic medical record. . Patient received an After-Visit Summary.  CMA or LPN served as scribe during this visit. History, Physical, and Plan performed by  medical provider. The above documentation has been reviewed and is accurate and complete.  Inda Coke, PA-C

## 2018-07-23 NOTE — Patient Instructions (Signed)
It was great to see you!  You have a viral upper respiratory infection. Antibiotics are not needed for this.  Viral infections usually take 7-10 days to resolve.  The cough can last a few weeks to go away.   Push fluids and get plenty of rest. Please return if you are not improving as expected, or if you have high fevers (>101.5) or difficulty swallowing or worsening productive cough.  Call clinic with questions.  I hope you start feeling better soon!

## 2018-07-27 ENCOUNTER — Encounter: Payer: Self-pay | Admitting: Family Medicine

## 2018-08-01 ENCOUNTER — Telehealth: Payer: Self-pay

## 2018-08-01 NOTE — Telephone Encounter (Signed)
Questions for Screening COVID-19  Symptom onset: None  Travel or Contacts: Patient and Co-Worker traveled to [place] and Co-Worker traveled to [place] alone and then returned to office. Pt has had contact with Co-worker. Pt also stated that some other co-workers have traveled to [place]. Pt employer is wanting pt to be tested to rule out Covid-19.  During this illness, did/does the patient experience any of the following symptoms? Fever >100.11F []   Yes [x]   No []   Unknown Subjective fever (felt feverish) []   Yes [x]   No []   Unknown Chills []   Yes [x]   No []   Unknown Muscle aches (myalgia) []   Yes [x]   No []   Unknown Runny nose (rhinorrhea) []   Yes [x]   No []   Unknown Sore throat []   Yes [x]   No []   Unknown Cough (new onset or worsening of chronic cough) []   Yes [x]   No []   Unknown Shortness of breath (dyspnea) []   Yes [x]   No []   Unknown Nausea or vomiting []   Yes [x]   No []   Unknown Headache []   Yes [x]   No []   Unknown Abdominal pain  []   Yes [x]   No []   Unknown Diarrhea (?3 loose/looser than normal stools/24hr period) []   Yes [x]   No []   Unknown Other, specify:  Patient risk factors: Smoker? []   Current []   Former [x]   Never If male, currently pregnant? []   Yes [x]   No  Patient Active Problem List   Diagnosis Date Noted  . Vitiligo 02/07/2017  . Insomnia 03/11/2015  . 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 Non-toxic nodular goiter 11/14/2012  . Left sided ulcerative (chronic) colitis ([place]) 05/30/2012  . Rosacea 07/21/2010  . Tinnitus 04/08/2009  . Hypothyroidism 03/27/2007    Plan:  []   High risk for COVID-19 with red flags go to ED (with CP, SOB, weak/lightheaded, or fever > 101.5). Call ahead.  [x]   High risk for COVID-19 but stable will have car visit. Inform provider and coordinate time. Will be completed in afternoon. []   No red flags but URI signs or symptoms will go through side door and be seen in dedicated  room.  Note: Referral to telemedicine is an appropriate alternative disposition for higher risk but stable. Tanner White Telehealth/e-Visit: [phone].

## 2018-08-01 NOTE — Telephone Encounter (Signed)
Please see message and advise.  Thank you. ° °

## 2018-08-02 ENCOUNTER — Ambulatory Visit (INDEPENDENT_AMBULATORY_CARE_PROVIDER_SITE_OTHER): Payer: Managed Care, Other (non HMO) | Admitting: Family Medicine

## 2018-08-02 ENCOUNTER — Encounter: Payer: Self-pay | Admitting: Family Medicine

## 2018-08-02 VITALS — HR 81 | Temp 98.0°F | Ht 72.0 in

## 2018-08-02 DIAGNOSIS — R05 Cough: Secondary | ICD-10-CM | POA: Diagnosis not present

## 2018-08-02 DIAGNOSIS — R059 Cough, unspecified: Secondary | ICD-10-CM

## 2018-08-02 DIAGNOSIS — R509 Fever, unspecified: Secondary | ICD-10-CM | POA: Diagnosis not present

## 2018-08-02 NOTE — Progress Notes (Signed)
Phone [phone]   Subjective:  Tanner White is a 53 y.o. year old very pleasant male patient who presents for/with See problem oriented charting ROS-fever now resolved.  Persistent cough but improving.  No shortness of breath.  No edema.  Past Medical History-  Patient Active Problem List   Diagnosis Date Noted  . 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 Left sided ulcerative (chronic) colitis ([place]) 05/30/2012    Priority: High  . Insomnia 03/11/2015    Priority: Medium  . 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 Hypothyroidism 03/27/2007    Priority: Medium  . Vitiligo 02/07/2017    Priority: Low  . Rosacea 07/21/2010    Priority: Low  . Tinnitus 04/08/2009    Priority: Low    Medications- reviewed and updated Current Outpatient Medications  Medication Sig Dispense Refill  . levothyroxine (SYNTHROID, LEVOTHROID) 100 MCG tablet 100 mcg daily except for Tues ,Thurs& Sun take 1 1/2 (137mg) 115 tablet 3  . mesalamine (LIALDA) 1.2 g EC tablet TAKE 2 TABLETS(2.4 GRAMS) BY MOUTH DAILY WITH BREAKFAST 60 tablet 11  . predniSONE (DELTASONE) 10 MG tablet Take 10 mg by mouth at bedtime.     . Sulfacetamide-Sulfur-Sunscreen (PRASCION RA) 10-5 % CREA Apply topically daily.     No current facility-administered medications for this visit.      Objective:  Pulse 81   Temp 98 F (36.7 C) (Oral)   Ht 6' (1.829 m)   SpO2 96%   BMI 25.23 kg/m  Gen: NAD, resting comfortably.  No cough during visit  CV: RRR  Lungs: nonlabored, normal respiratory rate Abdomen: soft/nondistended  Skin: warm, dry, no rash    Assessment and Plan    Cough - Plan: Coronavirus CoVID-19 ([facility])  Fever, unspecified fever cause - Plan: Coronavirus CoVID-19 ([facility])   S: Patient was seen on March 9 and diagnosed with flulike illness.  His flu test was negative at that time.  He has had a continuous  cough since that time-fortunately has not had a fever since Monday.  He does admit to some watery eyes and sneezing/seasonal allergies-allergy medicines seem to help.  Max temperature at the beginning of illness was up to 101.  He initially had chest congestion but that has improved.  He did have body aches initially.  Over 10 days of symptoms now.  Patient reports that before he became ill [place] and [place].  His employer requested for him to be coronavirus tested.  Though patient has had cough and fever-he has not had shortness of breath.  He seems to be improving overall.  Questions for Screening COVID-19-completed prior to visit  Symptom onset: None  Travel or Contacts: [place]. Pt has had contact with Co-worker. Pt also stated that some other [place] employer is wanting pt to be tested to rule out Covid-19.  During this illness, did/does the patient experience any of the following symptoms? Fever >100.61F [] ?  Yes [x] ?  No [] ?  Unknown Subjective fever (felt feverish) [] ?  Yes [x] ?  No [] ?  Unknown Chills [] ?  Yes [x] ?  No [] ?  Unknown Muscle aches (myalgia) [] ?  Yes [x] ?  No [] ?  Unknown Runny nose (rhinorrhea) [] ?  Yes [x] ?  No [] ?  Unknown Sore throat [] ?  Yes [x] ?  No [] ?  Unknown Cough (new onset or worsening of chronic cough) [] ?  Yes [x] ?  No [] ?  Unknown Shortness of breath (dyspnea) [] ?  Yes [x] ?  No [] ?  Unknown Nausea or vomiting [] ?  Yes [x] ?  No [] ?  Unknown Headache [] ?  Yes [x] ?  No [] ?  Unknown Abdominal pain  [] ?  Yes [x] ?  No [] ?  Unknown Diarrhea (?3 loose/looser than normal stools/24hr period) [] ?  Yes [x] ?   No [] ?  Unknown Other, specify:  Patient risk factors: Smoker? [] ?  Current [] ?  Former [x] ?  Never If male, currently pregnant? [] ?  Yes [x] ?  No Plan:  [] ?  High risk for COVID-19 with red flags go to ED (with CP, SOB, weak/lightheaded, or fever > 101.5). Call ahead.  [x] ?  High risk for COVID-19 but stable will have car visit. Inform provider and coordinate time. Will be completed in afternoon. [] ?  No red flags but URI signs or symptoms will go through side door and be seen in dedicated room.    A/P: 53 year old man with lingering cough which could be allergy related after a flulike illness.  Overall symptoms are improving.  Since patient has had contact with people from [place] as well as [place]-employer is requesting covid-19 testing-I believe that is a reasonable request and we completed that today.  Flu test was not repeated as this was negative last week. - Person under investigation form was sent to infection prevention as well as department of Health and Human Services instead to [place]. -Patient signed form for patient under investigation and understands he is to self quarantine until negative results. -Suspect this may be allergies-patient taking Zyrtec now and finds it somewhat helpful -He is on chronic prednisone (ocular myasthenia gravis) and we discussed potentially stopping that if covid-19 test comes back positive-we are really hoping for a negative result though  Future Appointments  Date Time Provider [place]  11/19/2018  9:40 AM Yong Channel, Brayton Mars, MD [facility]   Lab/Order associations: Cough - Plan: Coronavirus CoVID-19 ([facility])  Fever, unspecified fever cause - Plan: Coronavirus CoVID-19 ([facility])  Return precautions advised.  Garret Reddish, MD

## 2018-08-02 NOTE — Patient Instructions (Addendum)
Car visit- declined avs

## 2018-08-03 LAB — SARS CORONAVIRUS WITH COV-2 RNA, QUAL REAL TIME RT-PCR
Overall Result:: NOT DETECTED
Pan-SARS RNA: NEGATIVE
SARS Cov2 RNA: NEGATIVE

## 2018-10-19 ENCOUNTER — Other Ambulatory Visit: Payer: Self-pay | Admitting: Family Medicine

## 2018-11-19 ENCOUNTER — Other Ambulatory Visit: Payer: Self-pay

## 2018-11-19 ENCOUNTER — Encounter: Payer: Self-pay | Admitting: Family Medicine

## 2018-11-19 ENCOUNTER — Ambulatory Visit (INDEPENDENT_AMBULATORY_CARE_PROVIDER_SITE_OTHER): Payer: Managed Care, Other (non HMO) | Admitting: Family Medicine

## 2018-11-19 VITALS — BP 110/80 | HR 59 | Temp 98.3°F | Ht 71.0 in | Wt 184.0 lb

## 2018-11-19 DIAGNOSIS — G7000 Myasthenia gravis without (acute) exacerbation: Secondary | ICD-10-CM

## 2018-11-19 DIAGNOSIS — G4700 Insomnia, unspecified: Secondary | ICD-10-CM

## 2018-11-19 DIAGNOSIS — E663 Overweight: Secondary | ICD-10-CM

## 2018-11-19 DIAGNOSIS — Z0001 Encounter for general adult medical examination with abnormal findings: Secondary | ICD-10-CM

## 2018-11-19 DIAGNOSIS — E039 Hypothyroidism, unspecified: Secondary | ICD-10-CM | POA: Diagnosis not present

## 2018-11-19 DIAGNOSIS — R0789 Other chest pain: Secondary | ICD-10-CM

## 2018-11-19 DIAGNOSIS — Z79899 Other long term (current) drug therapy: Secondary | ICD-10-CM | POA: Diagnosis not present

## 2018-11-19 DIAGNOSIS — K5150 Left sided colitis without complications: Secondary | ICD-10-CM | POA: Diagnosis not present

## 2018-11-19 DIAGNOSIS — Z23 Encounter for immunization: Secondary | ICD-10-CM

## 2018-11-19 DIAGNOSIS — Z125 Encounter for screening for malignant neoplasm of prostate: Secondary | ICD-10-CM

## 2018-11-19 DIAGNOSIS — E781 Pure hyperglyceridemia: Secondary | ICD-10-CM

## 2018-11-19 LAB — COMPREHENSIVE METABOLIC PANEL
ALT: 12 U/L (ref 0–53)
AST: 11 U/L (ref 0–37)
Albumin: 4.4 g/dL (ref 3.5–5.2)
Alkaline Phosphatase: 51 U/L (ref 39–117)
BUN: 15 mg/dL (ref 6–23)
CO2: 26 mEq/L (ref 19–32)
Calcium: 9.1 mg/dL (ref 8.4–10.5)
Chloride: 104 mEq/L (ref 96–112)
Creatinine, Ser: 1 mg/dL (ref 0.40–1.50)
GFR: 78.81 mL/min (ref >60.00)
Glucose, Bld: 81 mg/dL (ref 70–99)
Potassium: 3.7 mEq/L (ref 3.5–5.1)
Sodium: 139 mEq/L (ref 135–145)
Total Bilirubin: 0.7 mg/dL (ref 0.2–1.2)
Total Protein: 6.9 g/dL (ref 6.0–8.3)

## 2018-11-19 LAB — LIPID PANEL
Cholesterol: 153 mg/dL (ref 0–200)
HDL: 52.4 mg/dL (ref >39.00)
LDL Cholesterol: 73 mg/dL (ref 0–99)
NonHDL: 100.2
Total CHOL/HDL Ratio: 3
Triglycerides: 136 mg/dL (ref 0.0–149.0)
VLDL: 27.2 mg/dL (ref 0.0–40.0)

## 2018-11-19 LAB — CBC
HCT: 43.5 % (ref 39.0–52.0)
Hemoglobin: 14.6 g/dL (ref 13.0–17.0)
MCHC: 33.6 g/dL (ref 30.0–36.0)
MCV: 91.5 fl (ref 78.0–100.0)
Platelets: 184 10*3/uL (ref 150.0–400.0)
RBC: 4.75 Mil/uL (ref 4.22–5.81)
RDW: 13.4 % (ref 11.5–15.5)
WBC: 7.7 10*3/uL (ref 4.0–10.5)

## 2018-11-19 LAB — HEMOGLOBIN A1C: Hgb A1c MFr Bld: 4.9 % (ref 4.6–6.5)

## 2018-11-19 LAB — PSA: PSA: 0.47 ng/mL (ref 0.10–4.00)

## 2018-11-19 LAB — TSH: TSH: 0.41 u[IU]/mL (ref 0.35–4.50)

## 2018-11-19 NOTE — Patient Instructions (Addendum)
Health Maintenance Due  Topic Date Due  . Samul Dada - today before you leave 05/16/2017   Please stop by lab before you go If you do not have mychart- we will call you about results within 5 business days of [place] receiving them.  If you have mychart- we will send your results within 3 business days of [place] receiving them.  If abnormal or we want to clarify a result, we will call or mychart you to make sure you receive the message.  If you have questions or concerns or don't hear within 5-7 days, please send [place] a message or call us.   EKG looks pretty good- lets see how cholesterol looks and we can consider coronary CT scan if you would like. Could also try gas x to see if that's helpful when occurs  The 10-year ASCVD risk score Mikey Bussing DC Brooke Bonito., et al., 2013) is: 2%   Values used to calculate the score:     Age: 71 years     Sex: Male     Is Non-Hispanic African American: No     Diabetic: No     Tobacco smoker: No     Systolic Blood Pressure: 852 mmHg     Is BP treated: No     HDL Cholesterol: 58 mg/dL     Total Cholesterol: 184 mg/dL

## 2018-11-19 NOTE — Addendum Note (Signed)
Addended by: Gwenyth Ober R on: 11/19/2018 10:58 AM   Modules accepted: Orders

## 2018-11-19 NOTE — Progress Notes (Signed)
Phone: [phone]   Subjective:  Patient presents today for their annual physical. Chief complaint-noted.   See problem oriented charting- ROS- full  review of systems was completed and negative except for: tinnitus  The following were reviewed and entered/updated in epic: Past Medical History:  Diagnosis Date  . Condyloma acuminata   . GERD (gastroesophageal reflux disease)   . Heart murmur    slight, likely since childhood  . Hypothyroidism   . MRSA infection 2009   OS; Dr Lucita Ferrara  . Rosacea    Patient Active Problem List   Diagnosis Date Noted  . 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 Left sided ulcerative (chronic) colitis ([place]) 05/30/2012    Priority: High  . Insomnia 03/11/2015    Priority: Medium  . 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 Hypothyroidism 03/27/2007    Priority: Medium  . Vitiligo 02/07/2017    Priority: Low  . Rosacea 07/21/2010    Priority: Low  . Tinnitus 04/08/2009    Priority: Low   Past Surgical History:  Procedure Laterality Date  . BIOPSY THYROID  11/07/12   FNA: goiter  . COLONOSCOPY  2002 , 2008, 2014   colitis; Dr Fuller Plan  . LASIK Bilateral 2001  . TONSILLECTOMY    . WISDOM TOOTH EXTRACTION      Family History  Problem Relation Age of Onset  . Hypothyroidism Mother        also ocular tumor  . Osteoarthritis Father   . Cancer Maternal Uncle        intra-abdominal  . Colon polyps Paternal Uncle   . Hypertension Maternal Grandfather   . Lung cancer Maternal Grandfather        smoker  . Thyroid disease Maternal Grandmother        hypothyroidism  . Heart disease Paternal Uncle        valvular heart disease  . Colon cancer Maternal Uncle   . Diverticulitis Paternal Grandmother   . Colon cancer Paternal Grandmother 21  . Pancreatic cancer Paternal Uncle   . Esophageal cancer Neg Hx   . Stomach cancer Neg Hx   .  Rectal cancer Neg Hx   . Diabetes Neg Hx     Medications- reviewed and updated Current Outpatient Medications  Medication Sig Dispense Refill  . levothyroxine (SYNTHROID) 100 MCG tablet TAKE 1 TABLET BY MOUTH DAILY EXCEPT FOR TUESDAY, THURSDAY AND SUNDAY TAKE 1 AND 1/2 TABLETS 115 tablet 3  . predniSONE (DELTASONE) 10 MG tablet Take 10 mg by mouth at bedtime.     . Sulfacetamide-Sulfur-Sunscreen (PRASCION RA) 10-5 % CREA Apply topically daily.     No current facility-administered medications for this visit.     Allergies-reviewed and updated Allergies  Allergen Reactions  . Doxycycline     hives    Social History   Social History Narrative   Married. 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 He still IT sales professional: bike riding- road and mountain, hiking, [place]   Objective  Objective:  BP 110/80   Pulse (!) 59   Temp 98.3 F (36.8 C) (Oral)   Ht 5' 11"  (1.803 m)   Wt 184 lb (83.5 kg)   SpO2 99%   BMI 25.66 kg/m  Gen: NAD, resting comfortably  HEENT: Mucous membranes are moist. Oropharynx normal Neck: no thyromegaly CV: RRR no murmurs rubs or gallops Lungs: CTAB no crackles, wheeze, rhonchi Abdomen: soft/nontender/nondistended/normal bowel sounds. No rebound or guarding.  Ext: no edema Skin: warm, dry Neuro: grossly normal, moves all extremities, PERRLA Rectal: normal tone, normal sized prostate, no masses or tenderness  EKG: sinus bradycardis with rate 55, normal axis, normal intervals- qrs under 120 msec, no hypertrophy, no st or t wave chages    Assessment and Plan  53 y.o. male presenting for annual physical.  Health Maintenance counseling: 1. Anticipatory guidance: Patient counseled regarding regular dental exams -q6 months, eye exams yearly--has corneal scar.  Also sees neurology for ocular myasthenia and history of diplopia (trying to get to 15 mg of  prednisone every other day and then starts to note issues after a few months- if goes to 10 every day goes away,  avoiding smoking and second hand smoke , limiting alcohol to 2 beverages per day .   2. Risk factor reduction:  Advised patient of need for regular exercise and diet rich and fruits and vegetables to reduce risk of heart attack and stroke. Exercise- about 2 days every other week. Diet-  Weight stable from last physical. Also on prednisone which could increase weight but he has kept it stable- really only eats when hungry Wt Readings from Last 3 Encounters:  11/19/18 184 lb (83.5 kg)  07/23/18 186 lb (84.4 kg)  11/14/17 183 lb (83 kg)  3. Immunizations/screenings/ancillary studies- Tdap today Immunization History  Administered Date(s) Administered  . Influenza Whole 03/27/2007, 04/08/2009  . Influenza,inj,Quad PF,6+ Mos 03/13/2013, 05/26/2016, 03/02/2018  . Influenza-Unspecified 06/07/2018  . PPD Test 03/28/2016  . Td 05/17/2007   4. Prostate cancer screening- once yearly PSA before age 3 due to family history.  Update today-prior low risk PSA trend Lab Results  Component Value Date   PSA 0.52 11/14/2017   PSA 0.59 05/19/2016   PSA 0.54 08/11/2011   5. Colon cancer screening - follows closely with GI due to ulcerative colitis history-last colonoscopy November 2018 with 3-year repeat 6. Skin cancer screening-follows with Dr. Nevada Crane dermatology.advised regular sunscreen use. Denies worrisome, changing, or new skin lesions.  7. never smoker  Status of chronic or acute concerns  Hypothyroidism - taking Levothyroxine 100 mcg 1 tablet 4 days weekly and 1.5 tablets 3 days weekly.  Lab Results  Component Value Date   TSH 0.38 07/03/2018  Patient has also had nodular goiter- biopsy June 2014 the left thyroid nodule 2 x 2 x 1 cm.  For Dr. Janan Halter to keep TSH near 1 or below  Insomnia - feels like doing reasonably well right now- takes some time to get to sleep but once asleep  sleeps well  Ulcerative Colitis - taking Lialda in the past- now off as on prednisone for his eye and symptoms controlled. Gastroenterologist Dr. Fuller Plan.  Colonoscopy 2018-stable  Ocular myasthenia gravis- following up with [facility] and is on prednisone - monitoring bone density due to prednisone- we will also check an a1c  Elevated phq9 noted-high stress jo noted. Question midlife criss vs. Work stress. Considering changing job position. Considering restructuring his life- seemed more optimistic before. Trying times certainly doesn't help.  -Daughter doing well despite covid 52 and wife is as well (wife fights depression) -we opted to add counseling- handout for [facility]  Mild hypertriglyceridemia last check- update lipids today Lab Results  Component Value Date   CHOL 184 11/14/2017  HDL 58.00 11/14/2017   LDLCALC 92 11/14/2017   LDLDIRECT 85.0 05/19/2016   TRIG 170.0 (H) 11/14/2017   CHOLHDL 3 11/14/2017   #Atypical chest pain Still with pain at lower portion of sternum for about 3 years and not worsening- perhaps once a month- drinking a lot of water actually helps. Usually lasts 10-40 minutes. Can happen at night. Feels gaseous/distended. No other symptoms with it such as shortness of breath or diaphoresis - could be gas - will at least get baseline EKG to have on hand- appears reassuring but we discussed does not rule out cardiac disease - considering coronary CT as well to make sure no significant plaque which would make [place] more aggressive about lipid control.   Lab/Order associations: fasting   ICD-10-CM   1. Preventative health care  Z00.00 CBC    Comprehensive metabolic panel    Lipid panel    PSA    TSH  2. Left sided ulcerative (chronic) colitis ([place])  K51.50   3. Ocular myasthenia gravis ([facility]) Chronic G70.00   4. Hypothyroidism, unspecified type  E03.9 CBC    Comprehensive metabolic panel    TSH  5. Insomnia, unspecified type  G47.00    6. Hypertriglyceridemia  E78.1 CBC    Comprehensive metabolic panel    Lipid panel  7. Overweight  E66.3   8. Screening for prostate cancer  Z12.5 PSA  9. High risk medication use  Z79.899 Hemoglobin A1c  10. Atypical chest pain  R07.89 EKG 12-Lead   Return precautions advised.  Garret Reddish, MD

## 2019-11-05 ENCOUNTER — Other Ambulatory Visit: Payer: Self-pay | Admitting: Family Medicine

## 2019-12-10 ENCOUNTER — Telehealth: Payer: Self-pay | Admitting: Family Medicine

## 2019-12-10 DIAGNOSIS — Z79899 Other long term (current) drug therapy: Secondary | ICD-10-CM

## 2019-12-10 DIAGNOSIS — Z125 Encounter for screening for malignant neoplasm of prostate: Secondary | ICD-10-CM

## 2019-12-10 DIAGNOSIS — E781 Pure hyperglyceridemia: Secondary | ICD-10-CM

## 2019-12-10 DIAGNOSIS — Z0000 Encounter for general adult medical examination without abnormal findings: Secondary | ICD-10-CM

## 2019-12-10 DIAGNOSIS — E039 Hypothyroidism, unspecified: Secondary | ICD-10-CM

## 2019-12-10 NOTE — Addendum Note (Signed)
Addended by: Marin Olp on: 12/10/2019 12:29 PM   Modules accepted: Orders

## 2019-12-10 NOTE — Telephone Encounter (Signed)
Patient requested an appointment for an annual physical, called patient and scheduled him for 04/23/20, Tanner White would like to know if it would be possible to do CPE labs prior to the appointment.

## 2019-12-10 NOTE — Telephone Encounter (Signed)
My Chart message sent

## 2019-12-10 NOTE — Telephone Encounter (Signed)
Ok for labs prior to appt? Please advise

## 2019-12-10 NOTE — Telephone Encounter (Signed)
I ordered labs for him.

## 2020-02-20 ENCOUNTER — Other Ambulatory Visit: Payer: Self-pay | Admitting: Family Medicine

## 2020-04-21 ENCOUNTER — Other Ambulatory Visit: Payer: Self-pay

## 2020-04-21 ENCOUNTER — Other Ambulatory Visit: Payer: PRIVATE HEALTH INSURANCE

## 2020-04-21 DIAGNOSIS — Z0000 Encounter for general adult medical examination without abnormal findings: Secondary | ICD-10-CM

## 2020-04-21 DIAGNOSIS — E781 Pure hyperglyceridemia: Secondary | ICD-10-CM

## 2020-04-21 DIAGNOSIS — Z125 Encounter for screening for malignant neoplasm of prostate: Secondary | ICD-10-CM

## 2020-04-21 DIAGNOSIS — Z79899 Other long term (current) drug therapy: Secondary | ICD-10-CM

## 2020-04-21 DIAGNOSIS — E039 Hypothyroidism, unspecified: Secondary | ICD-10-CM

## 2020-04-21 NOTE — Addendum Note (Signed)
Addended by: Liliane Channel on: 04/21/2020 08:16 AM   Modules accepted: Orders

## 2020-04-22 LAB — CBC WITH DIFFERENTIAL/PLATELET
Absolute Monocytes: 354 cells/uL (ref 200–950)
Basophils Absolute: 23 cells/uL (ref 0–200)
Basophils Relative: 0.3 %
Eosinophils Absolute: 23 cells/uL (ref 15–500)
Eosinophils Relative: 0.3 %
HCT: 43.8 % (ref 38.5–50.0)
Hemoglobin: 15.1 g/dL (ref 13.2–17.1)
Lymphs Abs: 1517 cells/uL (ref 850–3900)
MCH: 30.6 pg (ref 27.0–33.0)
MCHC: 34.5 g/dL (ref 32.0–36.0)
MCV: 88.8 fL (ref 80.0–100.0)
MPV: 11.2 fL (ref 7.5–12.5)
Monocytes Relative: 4.6 %
Neutro Abs: 5783 cells/uL (ref 1500–7800)
Neutrophils Relative %: 75.1 %
Platelets: 223 10*3/uL (ref 140–400)
RBC: 4.93 10*6/uL (ref 4.20–5.80)
RDW: 12.7 % (ref 11.0–15.0)
Total Lymphocyte: 19.7 %
WBC: 7.7 10*3/uL (ref 3.8–10.8)

## 2020-04-22 LAB — LIPID PANEL
Cholesterol: 178 mg/dL (ref <200)
HDL: 64 mg/dL (ref >=40)
LDL Cholesterol (Calc): 97 mg/dL (calc)
Non-HDL Cholesterol (Calc): 114 mg/dL (calc) (ref <130)
Total CHOL/HDL Ratio: 2.8 (calc) (ref <5.0)
Triglycerides: 80 mg/dL (ref <150)

## 2020-04-22 LAB — HEMOGLOBIN A1C
Hgb A1c MFr Bld: 4.8 % of total Hgb (ref <5.7)
Mean Plasma Glucose: 91 mg/dL
eAG (mmol/L): 5 mmol/L

## 2020-04-22 LAB — TSH: TSH: 0.32 mIU/L — ABNORMAL LOW (ref 0.40–4.50)

## 2020-04-22 LAB — COMPREHENSIVE METABOLIC PANEL
AG Ratio: 1.8 (calc) (ref 1.0–2.5)
ALT: 14 U/L (ref 9–46)
AST: 14 U/L (ref 10–35)
Albumin: 4.2 g/dL (ref 3.6–5.1)
Alkaline phosphatase (APISO): 44 U/L (ref 35–144)
BUN: 17 mg/dL (ref 7–25)
CO2: 28 mmol/L (ref 20–32)
Calcium: 9.4 mg/dL (ref 8.6–10.3)
Chloride: 105 mmol/L (ref 98–110)
Creat: 1.11 mg/dL (ref 0.70–1.33)
Globulin: 2.4 g/dL (calc) (ref 1.9–3.7)
Glucose, Bld: 96 mg/dL (ref 65–99)
Potassium: 4.5 mmol/L (ref 3.5–5.3)
Sodium: 139 mmol/L (ref 135–146)
Total Bilirubin: 0.9 mg/dL (ref 0.2–1.2)
Total Protein: 6.6 g/dL (ref 6.1–8.1)

## 2020-04-22 LAB — PSA: PSA: 0.44 ng/mL (ref <=4.0)

## 2020-04-22 NOTE — Progress Notes (Signed)
Phone: [phone]   Subjective:  Patient presents today for their annual physical. Chief complaint-noted.   See problem oriented charting- ROS- full  review of systems was completed and negative  except for: intermittent perhaps monthly ches tpain which resolves with drinking a lot fo water.   The following were reviewed and entered/updated in epic: Past Medical History:  Diagnosis Date  . Condyloma acuminata   . GERD (gastroesophageal reflux disease)   . Heart murmur    slight, likely since childhood  . Hypothyroidism   . MRSA infection 2009   OS; Dr Lucita Ferrara  . Rosacea    Patient Active Problem List   Diagnosis Date Noted  . 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 Left sided ulcerative (chronic) colitis ([place]) 05/30/2012    Priority: High  . Insomnia 03/11/2015    Priority: Medium  . 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 Hypothyroidism 03/27/2007    Priority: Medium  . Vitiligo 02/07/2017    Priority: Low  . Rosacea 07/21/2010    Priority: Low  . Tinnitus 04/08/2009    Priority: Low   Past Surgical History:  Procedure Laterality Date  . BIOPSY THYROID  11/07/12   FNA: goiter  . COLONOSCOPY  2002 , 2008, 2014   colitis; Dr Fuller Plan  . LASIK Bilateral 2001  . TONSILLECTOMY    . WISDOM TOOTH EXTRACTION      Family History  Problem Relation Age of Onset  . Hypothyroidism Mother        also ocular tumor  . Osteoarthritis Father   . Cancer Maternal Uncle        intra-abdominal  . Colon polyps Paternal Uncle   . Hypertension Maternal Grandfather   . Lung cancer Maternal Grandfather        smoker  . Thyroid disease Maternal Grandmother        hypothyroidism  . Heart disease Paternal Uncle        valvular heart disease  . Colon cancer Maternal Uncle   . Diverticulitis Paternal Grandmother   . Colon cancer Paternal Grandmother 48  . Pancreatic cancer  Paternal Uncle   . Esophageal cancer Neg Hx   . Stomach cancer Neg Hx   . Rectal cancer Neg Hx   . Diabetes Neg Hx     Medications- reviewed and updated Current Outpatient Medications  Medication Sig Dispense Refill  . predniSONE (DELTASONE) 10 MG tablet Take 10 mg by mouth at bedtime.     Marland Kitchen levothyroxine (SYNTHROID) 112 MCG tablet Take 1 tablet (112 mcg total) by mouth daily before breakfast. 90 tablet 3   No current facility-administered medications for this visit.    Allergies-reviewed and updated Allergies  Allergen Reactions  . Doxycycline     hives    Social History   Social History Narrative   Married. 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 He still IT sales professional: bike riding- road and mountain, hiking, shoot clays   Objective  Objective:  BP 120/70   Pulse 61   Temp 97.9 F (36.6 C) (Temporal)   Resp 18   Ht 5' 11"  (1.803 m)   Wt 185 lb (83.9 kg)   SpO2 99%   BMI 25.80 kg/m  Gen: NAD, resting comfortably HEENT: Mucous membranes  are moist. Oropharynx normal Neck: no thyromegaly CV: RRR no murmurs rubs or gallops Lungs: CTAB no crackles, wheeze, rhonchi Abdomen: soft/nontender/nondistended/normal bowel sounds. No rebound or guarding.  Ext: no edema Skin: warm, dry Neuro: grossly normal, moves all extremities, PERRLA   Assessment and Plan  54 y.o. male presenting for annual physical.  Health Maintenance counseling: 1. Anticipatory guidance: Patient counseled regarding regular dental exams q6 months, eye exams yearly,  avoiding smoking and second hand smoke, limiting alcohol to 2 beverages per day.   2. Risk factor reduction:  Advised patient of need for regular exercise and diet rich and fruits and vegetables to reduce risk of heart attack and stroke. Exercise- about 2 days every other week or less- recommended 150 minutes a week. Diet- trying to eat  reasonably healthy diet.  Wt Readings from Last 3 Encounters:  04/23/20 185 lb (83.9 kg)  11/19/18 184 lb (83.5 kg)  07/23/18 186 lb (84.4 kg)  3. Immunizations/screenings/ancillary studies- screen HCV with future labs. Flu shot  today. covid vaccine - undecided right now- advised to get this.  Immunization History  Administered Date(s) Administered  . Influenza Whole 03/27/2007, 04/08/2009  . Influenza,inj,Quad PF,6+ Mos 03/13/2013, 05/26/2016, 03/02/2018  . Influenza-Unspecified 06/07/2018  . PPD Test 03/28/2016  . Td 05/17/2007  . Tdap 11/19/2018   4. Prostate cancer screening- Family history of prostate cancer so screen annually.low risk psa trend.   Lab Results  Component Value Date   PSA 0.44 04/21/2020   PSA 0.47 11/19/2018   PSA 0.52 11/14/2017   5. Colon cancer screening - follows with GI due to ulcerative colitis. Last November 2018. Prior saw Dr. Fuller Plan. Needs to be seen with [facility] for new insurance- referral was placed today 6. Skin cancer screening- Dr. [place] Crane if needed. advised regular sunscreen use. Denies worrisome, changing, or new skin lesions other than 2 spots on his back and he will schedule visit  7. Never smoker 8. STD screening - only active with wife  Status of chronic or acute concerns   #hypothyroidism S: compliant On thyroid medication- Levothyroxine 100Mg 1 tablet 4 days a week and 1.5 tablets 3 days a week.  Lab Results  Component Value Date   TSH 0.32 (L) 04/21/2020  A/P: Thyroid is slightly overtreated.  We will change to 112 mcg daily which will be total weekly dosage a little over 60 mcg last than current dosage.  Would recommend 6-week recheck TSH-if not covered in network at that time would recommend establishing with [facility] be reasonable to go ahead and call and schedule. Dr. Teresa Pelton in past was trying to keep TSH near 1 or below.   #myasthenia gravis ocular- on prednisone 10 mg following with [facility].   #Ulcerative  colitis- on prednisone alone now- prior lialda prior to prednisone. Has done well in recent years. Last colonoscopy  2018- now due for repeat  #Insomnia- not having to take much right now- sparing advil pm   # some anxiety but not bad enoguht wants to do therapy/counseling  # intermittent atypical chest pain- perhaps once a month- still resolves with drinking large amount of water- wonders if gas related. Can hurt to back. Has opted out of coronary calcium  #hyperlipidemia S: Medication:none  Lab Results  Component Value Date   CHOL 178 04/21/2020   HDL 64 04/21/2020   LDLCALC 97 04/21/2020   LDLDIRECT 85.0 05/19/2016   TRIG 80 04/21/2020   CHOLHDL 2.8 04/21/2020   A/P: 10 year  ascvd risk only 2.5%/low- work on lifestyle and hold off on statin for now  Recommended follow up: Return in about 6 weeks (around 06/04/2020) for repeat thyroid testing either here or at new doctors office with [facility]. .  Lab/Order associations: fasting (labs done 2 days ago)   ICD-10-CM   1. Preventative health care  Z00.00   2. Hypothyroidism, unspecified type  E03.9   3. Encounter for screening colonoscopy  Z12.11 Ambulatory referral to Gastroenterology    CANCELED: Ambulatory referral to Gastroenterology  4. Encounter for hepatitis C screening test for low risk patient  Z11.59   5. Left sided ulcerative (chronic) colitis ([place])  K51.50 Ambulatory referral to Gastroenterology    CANCELED: Ambulatory referral to Gastroenterology    Meds ordered this encounter  Medications  . levothyroxine (SYNTHROID) 112 MCG tablet    Sig: Take 1 tablet (112 mcg total) by mouth daily before breakfast.    Dispense:  90 tablet    Refill:  3    Return precautions advised.  Garret Reddish, MD

## 2020-04-22 NOTE — Patient Instructions (Addendum)
Health Maintenance Due  Topic Date Due  . COVID-19 Vaccine (1) Has not received his vaccine. Please consider doing this at your pharmacy Never done  . INFLUENZA VACCINE In office flu shot today. 12/15/2019   Thyroid is slightly overtreated.  We will change to 112 mcg daily which will be total weekly dosage a little over 60 mcg last than current dosage.  Would recommend 6-week recheck TSH-if not covered in network at that time would recommend establishing with [facility] be reasonable to go ahead and call and schedule  Thanks for letting me be your doctor these few years. We will miss you!

## 2020-04-23 ENCOUNTER — Other Ambulatory Visit: Payer: Self-pay

## 2020-04-23 ENCOUNTER — Encounter: Payer: Self-pay | Admitting: Family Medicine

## 2020-04-23 ENCOUNTER — Ambulatory Visit (INDEPENDENT_AMBULATORY_CARE_PROVIDER_SITE_OTHER): Payer: PRIVATE HEALTH INSURANCE | Admitting: Family Medicine

## 2020-04-23 VITALS — BP 120/70 | HR 61 | Temp 97.9°F | Resp 18 | Ht 71.0 in | Wt 185.0 lb

## 2020-04-23 DIAGNOSIS — E039 Hypothyroidism, unspecified: Secondary | ICD-10-CM

## 2020-04-23 DIAGNOSIS — Z0000 Encounter for general adult medical examination without abnormal findings: Secondary | ICD-10-CM | POA: Diagnosis not present

## 2020-04-23 DIAGNOSIS — Z1159 Encounter for screening for other viral diseases: Secondary | ICD-10-CM | POA: Diagnosis not present

## 2020-04-23 DIAGNOSIS — K5150 Left sided colitis without complications: Secondary | ICD-10-CM

## 2020-04-23 DIAGNOSIS — G7000 Myasthenia gravis without (acute) exacerbation: Secondary | ICD-10-CM

## 2020-04-23 DIAGNOSIS — Z1211 Encounter for screening for malignant neoplasm of colon: Secondary | ICD-10-CM | POA: Diagnosis not present

## 2020-04-23 MED ORDER — LEVOTHYROXINE SODIUM 112 MCG PO TABS: 112.0000 ug | ORAL_TABLET | Freq: Every day | ORAL | 3 refills | Status: AC

## 2020-04-24 ENCOUNTER — Telehealth: Payer: Self-pay

## 2020-04-24 NOTE — Telephone Encounter (Signed)
For his referral to GI, Was he only supposed to have an colonoscopy done or was he supposed to have an office visit too. Me and [name] (from [place]) couldn't figure that part of the referral out.

## 2020-04-24 NOTE — Telephone Encounter (Signed)
Patrice from GI would like to know if patient needs a clinic visit before or just a colonoscopy

## 2020-04-24 NOTE — Telephone Encounter (Signed)
Returned call no answer, [facility]

## 2020-04-24 NOTE — Telephone Encounter (Signed)
He has ulcerative colitis so probably best to have in office visit first but hes also due for 3 year colonoscopy

## 2020-04-27 NOTE — Telephone Encounter (Signed)
Pt had cpe on 02/22/20 and it looks like a referral was placed to [facility] due to insurance change, does he need to come back and see you until the referral is processed?

## 2020-04-27 NOTE — Telephone Encounter (Signed)
Patient was just seen on December 9th does he need another appt?

## 2020-04-27 NOTE — Telephone Encounter (Signed)
Note he is going to be establishing with a [facility] primary care physician.  I am not sure why [place] said schedule follow-up visit with Dr. Garwin Brothers was trying to explain why he needed to be seen at [facility] gastroenterology-in office first with them but also may need colonoscopy

## 2020-04-29 NOTE — Telephone Encounter (Signed)
Called and advised [name] from [facility] that Dr. Yong Channel states that Tanner White would need and OV and his colonoscopy, [name] gave a verbal understanding. I also called and spoke with Tanner White to advise him that [facility] from [facility] will be giving him a call to get his appointments scheduled. He gave a verbal understanding also. Everything is taken care of!

## 2020-06-24 ENCOUNTER — Encounter: Payer: Self-pay | Admitting: Gastroenterology

## 2023-12-15 ENCOUNTER — Other Ambulatory Visit: Payer: Self-pay | Admitting: Oral Surgery

## 2023-12-19 LAB — SURGICAL PATHOLOGY
